# Patient Record
Sex: Female | Born: 1945 | Race: White | Hispanic: No | Smoking: Never smoker
Health system: Southern US, Community
[De-identification: ages and names within clinical notes are randomized; demographics above are authoritative.]

## PROBLEM LIST (undated history)

## (undated) DIAGNOSIS — R112 Nausea with vomiting, unspecified: Secondary | ICD-10-CM

## (undated) DIAGNOSIS — E039 Hypothyroidism, unspecified: Secondary | ICD-10-CM

## (undated) DIAGNOSIS — I351 Nonrheumatic aortic (valve) insufficiency: Secondary | ICD-10-CM

## (undated) DIAGNOSIS — H9190 Unspecified hearing loss, unspecified ear: Secondary | ICD-10-CM

## (undated) DIAGNOSIS — I071 Rheumatic tricuspid insufficiency: Secondary | ICD-10-CM

## (undated) DIAGNOSIS — E079 Disorder of thyroid, unspecified: Secondary | ICD-10-CM

## (undated) DIAGNOSIS — Z9889 Other specified postprocedural states: Secondary | ICD-10-CM

## (undated) DIAGNOSIS — D649 Anemia, unspecified: Secondary | ICD-10-CM

## (undated) DIAGNOSIS — I10 Essential (primary) hypertension: Secondary | ICD-10-CM

## (undated) DIAGNOSIS — K589 Irritable bowel syndrome without diarrhea: Secondary | ICD-10-CM

## (undated) DIAGNOSIS — I371 Nonrheumatic pulmonary valve insufficiency: Secondary | ICD-10-CM

## (undated) DIAGNOSIS — R011 Cardiac murmur, unspecified: Secondary | ICD-10-CM

## (undated) DIAGNOSIS — M1990 Unspecified osteoarthritis, unspecified site: Secondary | ICD-10-CM

## (undated) DIAGNOSIS — E785 Hyperlipidemia, unspecified: Secondary | ICD-10-CM

## (undated) DIAGNOSIS — K219 Gastro-esophageal reflux disease without esophagitis: Secondary | ICD-10-CM

## (undated) DIAGNOSIS — S83289A Other tear of lateral meniscus, current injury, unspecified knee, initial encounter: Secondary | ICD-10-CM

## (undated) DIAGNOSIS — F419 Anxiety disorder, unspecified: Secondary | ICD-10-CM

## (undated) DIAGNOSIS — F32A Depression, unspecified: Secondary | ICD-10-CM

## (undated) DIAGNOSIS — F329 Major depressive disorder, single episode, unspecified: Secondary | ICD-10-CM

## (undated) DIAGNOSIS — I340 Nonrheumatic mitral (valve) insufficiency: Secondary | ICD-10-CM

## (undated) DIAGNOSIS — R002 Palpitations: Secondary | ICD-10-CM

## (undated) HISTORY — PX: TONSILLECTOMY AND ADENOIDECTOMY: SUR1326

---

## 1972-03-19 HISTORY — PX: DILATION AND CURETTAGE OF UTERUS: SHX78

## 1974-03-19 HISTORY — PX: VAGINAL HYSTERECTOMY: SUR661

## 2000-03-19 HISTORY — PX: CHOLECYSTECTOMY: SHX55

## 2010-03-19 HISTORY — PX: COLONOSCOPY: SHX174

## 2011-05-20 ENCOUNTER — Other Ambulatory Visit: Payer: Self-pay | Admitting: Orthopedic Surgery

## 2011-06-19 ENCOUNTER — Encounter (HOSPITAL_BASED_OUTPATIENT_CLINIC_OR_DEPARTMENT_OTHER): Payer: Self-pay | Admitting: *Deleted

## 2011-06-19 NOTE — Progress Notes (Signed)
NPO AFTER MN WITH EXCEPTION WATER/ GATORADE UNTIL 0700. ARRIVES AT 1100. WILL TAKE PRILOSEC AM OF SURG W/ SIP WATER.

## 2011-06-27 ENCOUNTER — Encounter (HOSPITAL_BASED_OUTPATIENT_CLINIC_OR_DEPARTMENT_OTHER): Payer: Self-pay | Admitting: Anesthesiology

## 2011-06-27 ENCOUNTER — Encounter (HOSPITAL_BASED_OUTPATIENT_CLINIC_OR_DEPARTMENT_OTHER): Payer: Self-pay | Admitting: *Deleted

## 2011-06-27 ENCOUNTER — Ambulatory Visit (HOSPITAL_BASED_OUTPATIENT_CLINIC_OR_DEPARTMENT_OTHER)
Admission: RE | Admit: 2011-06-27 | Discharge: 2011-06-27 | Disposition: A | Discharge status: Home / Self Care | Payer: Medicare Other | Source: Ambulatory Visit | Attending: Orthopedic Surgery | Admitting: Orthopedic Surgery

## 2011-06-27 ENCOUNTER — Ambulatory Visit (HOSPITAL_BASED_OUTPATIENT_CLINIC_OR_DEPARTMENT_OTHER): Payer: Medicare Other | Admitting: Anesthesiology

## 2011-06-27 ENCOUNTER — Encounter (HOSPITAL_BASED_OUTPATIENT_CLINIC_OR_DEPARTMENT_OTHER)
Admission: RE | Disposition: A | Discharge status: Home / Self Care | Payer: Self-pay | Source: Ambulatory Visit | Attending: Orthopedic Surgery

## 2011-06-27 DIAGNOSIS — E785 Hyperlipidemia, unspecified: Secondary | ICD-10-CM | POA: Insufficient documentation

## 2011-06-27 DIAGNOSIS — IMO0002 Reserved for concepts with insufficient information to code with codable children: Secondary | ICD-10-CM | POA: Insufficient documentation

## 2011-06-27 DIAGNOSIS — X58XXXA Exposure to other specified factors, initial encounter: Secondary | ICD-10-CM | POA: Insufficient documentation

## 2011-06-27 DIAGNOSIS — S83289A Other tear of lateral meniscus, current injury, unspecified knee, initial encounter: Secondary | ICD-10-CM | POA: Diagnosis present

## 2011-06-27 DIAGNOSIS — Z79899 Other long term (current) drug therapy: Secondary | ICD-10-CM | POA: Insufficient documentation

## 2011-06-27 DIAGNOSIS — K219 Gastro-esophageal reflux disease without esophagitis: Secondary | ICD-10-CM | POA: Insufficient documentation

## 2011-06-27 HISTORY — DX: Nausea with vomiting, unspecified: Z98.890

## 2011-06-27 HISTORY — DX: Palpitations: R00.2

## 2011-06-27 HISTORY — DX: Cardiac murmur, unspecified: R01.1

## 2011-06-27 HISTORY — DX: Nausea with vomiting, unspecified: R11.2

## 2011-06-27 HISTORY — DX: Hyperlipidemia, unspecified: E78.5

## 2011-06-27 HISTORY — DX: Gastro-esophageal reflux disease without esophagitis: K21.9

## 2011-06-27 HISTORY — PX: KNEE ARTHROSCOPY: SHX127

## 2011-06-27 HISTORY — DX: Unspecified osteoarthritis, unspecified site: M19.90

## 2011-06-27 HISTORY — DX: Other tear of lateral meniscus, current injury, unspecified knee, initial encounter: S83.289A

## 2011-06-27 SURGERY — ARTHROSCOPY, KNEE
Anesthesia: General | Site: Knee | Laterality: Left | Wound class: Clean

## 2011-06-27 MED ORDER — CEFAZOLIN SODIUM-DEXTROSE 2-3 GM-% IV SOLR: 2.0000 g | INTRAVENOUS | Status: DC

## 2011-06-27 MED ORDER — OXYCODONE HCL 5 MG PO TABS: 5.0000 mg | ORAL_TABLET | ORAL | Status: AC | PRN

## 2011-06-27 MED ORDER — ONDANSETRON HCL 4 MG PO TABS: 4.0000 mg | ORAL_TABLET | Freq: Three times a day (TID) | ORAL | Status: AC | PRN

## 2011-06-27 MED ORDER — CEFAZOLIN SODIUM 1-5 GM-% IV SOLN
1.0000 g | INTRAVENOUS | Status: AC
  Administered 2011-06-27: 1 g via INTRAVENOUS

## 2011-06-27 MED ORDER — METHOCARBAMOL 500 MG PO TABS: 500.0000 mg | ORAL_TABLET | Freq: Four times a day (QID) | ORAL | Status: AC

## 2011-06-27 MED ORDER — KETOROLAC TROMETHAMINE 30 MG/ML IJ SOLN: 15.0000 mg | Freq: Once | INTRAMUSCULAR | Status: DC | PRN

## 2011-06-27 MED ORDER — PROPOFOL 10 MG/ML IV EMUL
INTRAVENOUS | Status: DC | PRN
  Administered 2011-06-27: 280 mg via INTRAVENOUS

## 2011-06-27 MED ORDER — ONDANSETRON HCL 4 MG/2ML IJ SOLN
INTRAMUSCULAR | Status: DC | PRN
  Administered 2011-06-27: 4 mg via INTRAVENOUS

## 2011-06-27 MED ORDER — FENTANYL CITRATE 0.05 MG/ML IJ SOLN
INTRAMUSCULAR | Status: DC | PRN
  Administered 2011-06-27 (×6): 50 ug via INTRAVENOUS

## 2011-06-27 MED ORDER — CHLORHEXIDINE GLUCONATE 4 % EX LIQD: 60.0000 mL | Freq: Once | CUTANEOUS | Status: DC

## 2011-06-27 MED ORDER — LACTATED RINGERS IV SOLN
INTRAVENOUS | Status: DC
  Administered 2011-06-27 (×3): via INTRAVENOUS

## 2011-06-27 MED ORDER — LIDOCAINE HCL (CARDIAC) 20 MG/ML IV SOLN
INTRAVENOUS | Status: DC | PRN
  Administered 2011-06-27: 80 mg via INTRAVENOUS

## 2011-06-27 MED ORDER — DIPHENHYDRAMINE HCL 50 MG/ML IJ SOLN
12.5000 mg | Freq: Once | INTRAMUSCULAR | Status: AC
  Administered 2011-06-27: 12.5 mg via INTRAVENOUS

## 2011-06-27 MED ORDER — DEXAMETHASONE SODIUM PHOSPHATE 10 MG/ML IJ SOLN: 10.0000 mg | Freq: Once | INTRAMUSCULAR | Status: DC

## 2011-06-27 MED ORDER — PROMETHAZINE HCL 25 MG/ML IJ SOLN: 6.2500 mg | INTRAMUSCULAR | Status: DC | PRN

## 2011-06-27 MED ORDER — FENTANYL CITRATE 0.05 MG/ML IJ SOLN: 25.0000 ug | INTRAMUSCULAR | Status: DC | PRN

## 2011-06-27 MED ORDER — OXYCODONE HCL 5 MG PO TABS
5.0000 mg | ORAL_TABLET | ORAL | Status: DC | PRN
  Administered 2011-06-27: 5 mg via ORAL

## 2011-06-27 MED ORDER — BUPIVACAINE-EPINEPHRINE 0.25% -1:200000 IJ SOLN
INTRAMUSCULAR | Status: DC | PRN
  Administered 2011-06-27: 25 mL

## 2011-06-27 MED ORDER — SODIUM CHLORIDE 0.9 % IR SOLN
Status: DC | PRN
  Administered 2011-06-27: 6000 mL

## 2011-06-27 MED ORDER — ACETAMINOPHEN 10 MG/ML IV SOLN
1000.0000 mg | Freq: Once | INTRAVENOUS | Status: AC
  Administered 2011-06-27: 1000 mg via INTRAVENOUS

## 2011-06-27 MED ORDER — SODIUM CHLORIDE 0.9 % IV SOLN: INTRAVENOUS | Status: DC

## 2011-06-27 SURGICAL SUPPLY — 35 items
BANDAGE ELASTIC 6 VELCRO ST LF (GAUZE/BANDAGES/DRESSINGS) ×2 IMPLANT
BLADE 4.2CUDA (BLADE) ×2 IMPLANT
BLADE CUDA SHAVER 3.5 (BLADE) IMPLANT
BLADE CUTTER GATOR 3.5 (BLADE) IMPLANT
CANISTER SUCT LVC 12 LTR MEDI- (MISCELLANEOUS) ×2 IMPLANT
CANISTER SUCTION 2500CC (MISCELLANEOUS) IMPLANT
CLOTH BEACON ORANGE TIMEOUT ST (SAFETY) ×2 IMPLANT
DRAPE ARTHROSCOPY W/POUCH 114 (DRAPES) ×4 IMPLANT
DRSG EMULSION OIL 3X3 NADH (GAUZE/BANDAGES/DRESSINGS) ×2 IMPLANT
DRSG PAD ABDOMINAL 8X10 ST (GAUZE/BANDAGES/DRESSINGS) ×2 IMPLANT
DURAPREP 26ML APPLICATOR (WOUND CARE) ×2 IMPLANT
ELECT MENISCUS 165MM 90D (ELECTRODE) IMPLANT
ELECT REM PT RETURN 9FT ADLT (ELECTROSURGICAL) ×2
ELECTRODE REM PT RTRN 9FT ADLT (ELECTROSURGICAL) ×1 IMPLANT
GLOVE BIO SURGEON STRL SZ8 (GLOVE) ×2 IMPLANT
GLOVE ECLIPSE 6.0 STRL STRAW (GLOVE) ×2 IMPLANT
GLOVE INDICATOR 6.5 STRL GRN (GLOVE) ×2 IMPLANT
GLOVE INDICATOR 8.0 STRL GRN (GLOVE) ×2 IMPLANT
GOWN PREVENTION PLUS LG XLONG (DISPOSABLE) ×4 IMPLANT
IV NS IRRIG 3000ML ARTHROMATIC (IV SOLUTION) ×2 IMPLANT
KNEE WRAP E Z 3 GEL PACK (MISCELLANEOUS) ×2 IMPLANT
PACK ARTHROSCOPY DSU (CUSTOM PROCEDURE TRAY) ×2 IMPLANT
PACK BASIN DAY SURGERY FS (CUSTOM PROCEDURE TRAY) ×2 IMPLANT
PADDING CAST ABS 4INX4YD NS (CAST SUPPLIES) ×1
PADDING CAST ABS COTTON 4X4 ST (CAST SUPPLIES) ×1 IMPLANT
PADDING CAST COTTON 6X4 STRL (CAST SUPPLIES) ×2 IMPLANT
PENCIL BUTTON HOLSTER BLD 10FT (ELECTRODE) IMPLANT
SET ARTHROSCOPY TUBING (MISCELLANEOUS) ×1
SET ARTHROSCOPY TUBING LN (MISCELLANEOUS) ×1 IMPLANT
SPONGE GAUZE 4X4 12PLY (GAUZE/BANDAGES/DRESSINGS) ×2 IMPLANT
SUT ETHILON 4 0 PS 2 18 (SUTURE) ×2 IMPLANT
TOWEL OR 17X24 6PK STRL BLUE (TOWEL DISPOSABLE) ×2 IMPLANT
WAND 30 DEG SABER W/CORD (SURGICAL WAND) IMPLANT
WAND 90 DEG TURBOVAC W/CORD (SURGICAL WAND) ×2 IMPLANT
WATER STERILE IRR 500ML POUR (IV SOLUTION) ×2 IMPLANT

## 2011-06-27 NOTE — Transfer of Care (Signed)
Immediate Anesthesia Transfer of Care Note  Patient: Tracey Kramer  Procedure(s) Performed: Procedure(s) (LRB): ARTHROSCOPY KNEE (Left)  Patient Location: PACU  Anesthesia Type: General  Level of Consciousness: drowsy, arouses to name, follows commands,  Airway & Oxygen Therapy: Patient Spontanous Breathing and Patient connected to face mask oxygen  Post-op Assessment: Report given to PACU RN and Post -op Vital signs reviewed and stable  Post vital signs: Reviewed and stable  Complications: No apparent anesthesia complications

## 2011-06-27 NOTE — Addendum Note (Signed)
Addendum  created 06/27/11 1400 by Eilene Ghazi, MD   Modules edited:Orders, PRL Based Order Sets

## 2011-06-27 NOTE — H&P (Signed)
  CC- Tracey Kramer is a 66 y.o. female who presents with left knee pain.  HPI- . Knee Pain: Patient presents with knee pain involving the  left knee. Onset of the symptoms was several months ago. Inciting event: none known. Current symptoms include giving out and pain located laterally. Pain is aggravated by going up and down stairs, rising after sitting and squatting.  Patient has had no prior knee problems. Evaluation to date: MRI with lateral meniscal tear.   Past Medical History  Diagnosis Date  . Acid reflux occasional  . Constipation   . Arthritis   . Acute lateral meniscal tear LEFT KNEE  . Heart palpitations   . Hyperlipidemia   . Heart murmur   . PONV (postoperative nausea and vomiting)     Past Surgical History  Procedure Date  . Cholecystectomy 2002  . Tonsillectomy and adenoidectomy AS CHILD  . Vaginal hysterectomy 1976  . Dilation and curettage of uterus 1974    Prior to Admission medications   Medication Sig Start Date End Date Taking? Authorizing Provider  estradiol (VIVELLE-DOT) 0.05 MG/24HR Place 1 patch onto the skin once a week.   Yes Historical Provider, MD  ibuprofen (ADVIL,MOTRIN) 200 MG tablet Take 200 mg by mouth every 6 (six) hours as needed.   Yes Historical Provider, MD  levothyroxine (SYNTHROID, LEVOTHROID) 88 MCG tablet Take 88 mcg by mouth every evening.   Yes Historical Provider, MD  omeprazole (PRILOSEC) 20 MG capsule Take 20 mg by mouth as needed.   Yes Historical Provider, MD  polyethylene glycol (MIRALAX / GLYCOLAX) packet Take 17 g by mouth daily.   Yes Historical Provider, MD  simvastatin (ZOCOR) 20 MG tablet Take 20 mg by mouth every evening.   Yes Historical Provider, MD   Knee Exam soft tissue tenderness over lateral joint line, negative drawer sign, collateral ligaments intact, positive McMurray sign, normal ipsilateral hip exam  Physical Examination: General appearance - alert, well appearing, and in no distress Mental status - alert,  oriented to person, place, and time Chest - clear to auscultation, no wheezes, rales or rhonchi, symmetric air entry Heart - normal rate, regular rhythm, normal S1, S2, no murmurs, rubs, clicks or gallops Abdomen - soft, nontender, nondistended, no masses or organomegaly Neurological - alert, oriented, normal speech, no focal findings or movement disorder noted   Asessment/Plan--- Left knee lateral meniscal tear- - Plan left knee arthroscopy with meniscal debridement. Procedure risks and potential comps discussed with patient who elects to proceed. Goals are decreased pain and increased function with a high likelihood of achieving both

## 2011-06-27 NOTE — Anesthesia Preprocedure Evaluation (Signed)
Anesthesia Evaluation  Patient identified by MRN, date of birth, ID band Patient awake    Reviewed: Allergy & Precautions, H&P , NPO status , Patient's Chart, lab work & pertinent test results  History of Anesthesia Complications (+) PONV  Airway Mallampati: II TM Distance: >3 FB Neck ROM: Full    Dental No notable dental hx. (+) Teeth Intact and Dental Advisory Given   Pulmonary neg pulmonary ROS,  breath sounds clear to auscultation  Pulmonary exam normal       Cardiovascular negative cardio ROS  + Valvular Problems/Murmurs Rhythm:Regular Rate:Normal     Neuro/Psych negative neurological ROS  negative psych ROS   GI/Hepatic negative GI ROS, Neg liver ROS, GERD-  ,  Endo/Other  Hypothyroidism   Renal/GU negative Renal ROS  negative genitourinary   Musculoskeletal negative musculoskeletal ROS (+)   Abdominal   Peds  Hematology negative hematology ROS (+)   Anesthesia Other Findings   Reproductive/Obstetrics negative OB ROS                           Anesthesia Physical Anesthesia Plan  ASA: II  Anesthesia Plan: General   Post-op Pain Management:    Induction: Intravenous  Airway Management Planned: LMA  Additional Equipment:   Intra-op Plan:   Post-operative Plan: Extubation in OR  Informed Consent: I have reviewed the patients History and Physical, chart, labs and discussed the procedure including the risks, benefits and alternatives for the proposed anesthesia with the patient or authorized representative who has indicated his/her understanding and acceptance.   Dental advisory given  Plan Discussed with: CRNA  Anesthesia Plan Comments:         Anesthesia Quick Evaluation

## 2011-06-27 NOTE — Anesthesia Postprocedure Evaluation (Signed)
  Anesthesia Post-op Note  Patient: Tracey Kramer  Procedure(s) Performed: Procedure(s) (LRB): ARTHROSCOPY KNEE (Left)  Patient Location: PACU  Anesthesia Type: General  Level of Consciousness: awake and alert   Airway and Oxygen Therapy: Patient Spontanous Breathing  Post-op Pain: mild  Post-op Assessment: Post-op Vital signs reviewed, Patient's Cardiovascular Status Stable, Respiratory Function Stable, Patent Airway and No signs of Nausea or vomiting  Post-op Vital Signs: stable  Complications: No apparent anesthesia complications

## 2011-06-27 NOTE — Op Note (Signed)
Preoperative diagnosis-  Left knee lateral meniscal tear  Postoperative diagnosis Left- knee lateral meniscal tear   Procedure- Left knee arthroscopy with lateral  Meniscal debridement   Surgeon- Gus [name]. Cerina Leary, MD  Anesthesia-General  EBL-  Minimal  Complications- None  Condition- PACU - hemodynamically stable.  Brief clinical note- -Tracey Kramer is a 66 y.o.  female with a several month history of left knee pain and mechanical symptoms. Exam and history suggested lateral meniscal tear confirmed by MRI. The patient presents now for arthroscopy and debridement  Procedure in detail -       After successful administration of General anesthetic, a tourmiquet is placed high on the Left  thigh and the Left lower extremity is prepped and draped in the usual sterile fashion. Time out is performed by the surgical team. Standard superomedial and inferolateral portal sites are marked and incisions made with an 11 blade. The inflow cannula is passed through the superomedial portal and camera through the inferolateral portal and inflow is initiated. Arthroscopic visualization proceeds.      The undersurface of the patella and trochlea are visualized and appear normal. The medial and lateral gutters are visualized and there are  no loose bodies. Flexion and valgus force is applied to the knee and the medial compartment is entered. A spinal needle is passed into the joint through the site marked for the inferomedial portal. A small incision is made and the dilator passed into the joint. The findings for the medial compartment are tiny chondral defect medial femoral condyle . The shaver is used to debride the unstable cartilage to a stable cartilaginous base with stable edges. It is probed and found to be stable.    The intercondylar notch is visualized and the ACL appears normal. The lateral compartment is entered and the findings are lateral meniscal tear and a tiny area of exposed bone on the lateral  tibial plateau adjacent to the tibial eminence . The tear is debrided to a stable base with baskets and a shaver and sealed off with the Arthrocare. There are no unstable chondral defects laterally.     The joint is again inspected and there are no other tears, defects or loose bodies identified. The arthroscopic equipment is then removed from the inferior portals which are closed with interrupted 4-0 nylon. 20 ml of .25% Marcaine with epinephrine are injected through the inflow cannula and the cannula is then removed and the portal closed with nylon. The incisions are cleaned and dried and a bulky sterile dressing is applied. The patient is then awakened and transported to recovery in stable condition.   06/27/2011, 1:06 PM

## 2011-06-27 NOTE — Anesthesia Procedure Notes (Signed)
Procedure Name: LMA Insertion Date/Time: 06/27/2011 12:35 PM Performed by: Fran Lowes Pre-anesthesia Checklist: Patient identified, Emergency Drugs available, Suction available and Patient being monitored Patient Re-evaluated:Patient Re-evaluated prior to inductionOxygen Delivery Method: Circle System Utilized Preoxygenation: Pre-oxygenation with 100% oxygen Intubation Type: IV induction Ventilation: Mask ventilation without difficulty LMA: LMA inserted LMA Size: 4.0 Number of attempts: 1 Airway Equipment and Method: bite block Placement Confirmation: positive ETCO2 Tube secured with: Tape Dental Injury: Teeth and Oropharynx as per pre-operative assessment

## 2011-06-27 NOTE — Interval H&P Note (Signed)
History and Physical Interval Note:  06/27/2011 12:28 PM  Tracey Kramer  has presented today for surgery, with the diagnosis of LEFT KNEE LATERAL MENISCAL TEAR  The various methods of treatment have been discussed with the patient and family. After consideration of risks, benefits and other options for treatment, the patient has consented to  Procedure(s) (LRB): ARTHROSCOPY KNEE (Left) as a surgical intervention .  The patients' history has been reviewed, patient examined, no change in status, stable for surgery.  I have reviewed the patients' chart and labs.  Questions were answered to the patient's satisfaction.     Loanne Drilling

## 2011-06-27 NOTE — Discharge Instructions (Addendum)
  Post Anesthesia Home Care Instructions  Activity: Get plenty of rest for the remainder of the day. A responsible adult should stay with you for 24 hours following the procedure.  For the next 24 hours, DO NOT: -Drive a car -Advertising copywriter -Drink alcoholic beverages -Take any medication unless instructed by your physician -Make any legal decisions or sign important papers.  Meals: Start with liquid foods such as gelatin or soup. Progress to regular foods as tolerated. Avoid greasy, spicy, heavy foods. If nausea and/or vomiting occur, drink only clear liquids until the nausea and/or vomiting subsides. Call your physician if vomiting continues.  Special Instructions/Symptoms: Your throat may feel dry or sore from the anesthesia or the breathing tube placed in your throat during surgery. If this causes discomfort, gargle with warm salt water. The discomfort should disappear within 24 hours.  Arthroscopic Procedure, Knee An arthroscopic procedure can find what is wrong with your knee. PROCEDURE Arthroscopy is a surgical technique that allows your orthopedic surgeon to diagnose and treat your knee injury with accuracy. They will look into your knee through a small instrument. This is almost like a small (pencil sized) telescope. Because arthroscopy affects your knee less than open knee surgery, you can anticipate a more rapid recovery. Taking an active role by following your caregiver's instructions will help with rapid and complete recovery. Use crutches, rest, elevation, ice, and knee exercises as instructed. The length of recovery depends on various factors including type of injury, age, physical condition, medical conditions, and your rehabilitation. Your knee is the joint between the large bones (femur and tibia) in your leg. Cartilage covers these bone ends which are smooth and slippery and allow your knee to bend and move smoothly. Two menisci, thick, semi-lunar shaped pads of cartilage  which form a rim inside the joint, help absorb shock and stabilize your knee. Ligaments bind the bones together and support your knee joint. Muscles move the joint, help support your knee, and take stress off the joint itself. Because of this all programs and physical therapy to rehabilitate an injured or repaired knee require rebuilding and strengthening your muscles. AFTER THE PROCEDURE  After the procedure, you will be moved to a recovery area until most of the effects of the medication have worn off. Your caregiver will discuss the test results with you.   Only take over-the-counter or prescription medicines for pain, discomfort, or fever as directed by your caregiver.  SEEK MEDICAL CARE IF:   You have increased bleeding from your wounds.   You see redness, swelling, or have increasing pain in your wounds.   You have pus coming from your wound.   You have an oral temperature above 102 F (38.9 C).   You notice a bad smell coming from the wound or dressing.   You have severe pain with any motion of your knee.  SEEK IMMEDIATE MEDICAL CARE IF:   You develop a rash.   You have difficulty breathing.   You have any allergic problems.  Document Released: 03/02/2000 Document Revised: 02/22/2011 Document Reviewed: 09/24/2007 [facility] Patient Information 2012 [place], [place].

## 2011-06-28 ENCOUNTER — Encounter (HOSPITAL_BASED_OUTPATIENT_CLINIC_OR_DEPARTMENT_OTHER): Payer: Self-pay | Admitting: Orthopedic Surgery

## 2011-07-03 ENCOUNTER — Encounter (HOSPITAL_BASED_OUTPATIENT_CLINIC_OR_DEPARTMENT_OTHER): Payer: Self-pay

## 2011-10-23 NOTE — Addendum Note (Signed)
Addendum  created 10/23/11 0706 by Eilene Ghazi, MD   Modules edited:Anesthesia Attestations, Anesthesia Responsible Staff

## 2011-10-23 NOTE — Addendum Note (Signed)
Addendum  created 10/23/11 0706 by Afsana Liera, MD   Modules edited:Anesthesia Attestations, Anesthesia Responsible Staff    

## 2012-03-17 ENCOUNTER — Other Ambulatory Visit (HOSPITAL_COMMUNITY): Payer: Self-pay | Admitting: Orthopedic Surgery

## 2012-03-17 DIAGNOSIS — M898X6 Other specified disorders of bone, lower leg: Secondary | ICD-10-CM

## 2012-03-27 ENCOUNTER — Encounter (HOSPITAL_COMMUNITY): Payer: Medicare Other

## 2015-11-29 ENCOUNTER — Encounter (HOSPITAL_COMMUNITY): Payer: Medicare Other

## 2015-12-05 ENCOUNTER — Encounter (HOSPITAL_COMMUNITY): Admission: RE | Payer: Self-pay | Source: Ambulatory Visit

## 2015-12-05 ENCOUNTER — Inpatient Hospital Stay (HOSPITAL_COMMUNITY): Admission: RE | Admit: 2015-12-05 | Payer: Medicare Other | Source: Ambulatory Visit | Admitting: Orthopedic Surgery

## 2015-12-05 SURGERY — ARTHROPLASTY, KNEE, TOTAL
Anesthesia: Choice | Site: Knee | Laterality: Left

## 2017-01-04 ENCOUNTER — Observation Stay (HOSPITAL_BASED_OUTPATIENT_CLINIC_OR_DEPARTMENT_OTHER)
Admission: EM | Admit: 2017-01-04 | Discharge: 2017-01-06 | Disposition: A | Discharge status: Home / Self Care | Payer: Medicare HMO | Attending: Family Medicine | Admitting: Family Medicine

## 2017-01-04 ENCOUNTER — Encounter (HOSPITAL_BASED_OUTPATIENT_CLINIC_OR_DEPARTMENT_OTHER): Payer: Self-pay

## 2017-01-04 ENCOUNTER — Emergency Department (HOSPITAL_BASED_OUTPATIENT_CLINIC_OR_DEPARTMENT_OTHER): Payer: Medicare HMO

## 2017-01-04 DIAGNOSIS — I10 Essential (primary) hypertension: Secondary | ICD-10-CM | POA: Diagnosis not present

## 2017-01-04 DIAGNOSIS — N390 Urinary tract infection, site not specified: Secondary | ICD-10-CM | POA: Diagnosis not present

## 2017-01-04 DIAGNOSIS — Z7982 Long term (current) use of aspirin: Secondary | ICD-10-CM | POA: Diagnosis not present

## 2017-01-04 DIAGNOSIS — K529 Noninfective gastroenteritis and colitis, unspecified: Secondary | ICD-10-CM

## 2017-01-04 DIAGNOSIS — K862 Cyst of pancreas: Secondary | ICD-10-CM | POA: Diagnosis not present

## 2017-01-04 DIAGNOSIS — E039 Hypothyroidism, unspecified: Secondary | ICD-10-CM | POA: Diagnosis present

## 2017-01-04 DIAGNOSIS — Z79899 Other long term (current) drug therapy: Secondary | ICD-10-CM | POA: Diagnosis not present

## 2017-01-04 DIAGNOSIS — A020 Salmonella enteritis: Secondary | ICD-10-CM | POA: Insufficient documentation

## 2017-01-04 DIAGNOSIS — Z79890 Hormone replacement therapy (postmenopausal): Secondary | ICD-10-CM | POA: Diagnosis not present

## 2017-01-04 DIAGNOSIS — E860 Dehydration: Secondary | ICD-10-CM | POA: Diagnosis not present

## 2017-01-04 DIAGNOSIS — Z880 Allergy status to penicillin: Secondary | ICD-10-CM | POA: Diagnosis not present

## 2017-01-04 DIAGNOSIS — K5100 Ulcerative (chronic) pancolitis without complications: Principal | ICD-10-CM | POA: Diagnosis present

## 2017-01-04 DIAGNOSIS — M1990 Unspecified osteoarthritis, unspecified site: Secondary | ICD-10-CM | POA: Diagnosis not present

## 2017-01-04 DIAGNOSIS — E785 Hyperlipidemia, unspecified: Secondary | ICD-10-CM | POA: Diagnosis present

## 2017-01-04 DIAGNOSIS — E876 Hypokalemia: Secondary | ICD-10-CM | POA: Diagnosis not present

## 2017-01-04 DIAGNOSIS — K589 Irritable bowel syndrome without diarrhea: Secondary | ICD-10-CM | POA: Diagnosis not present

## 2017-01-04 DIAGNOSIS — Z9049 Acquired absence of other specified parts of digestive tract: Secondary | ICD-10-CM | POA: Insufficient documentation

## 2017-01-04 DIAGNOSIS — E871 Hypo-osmolality and hyponatremia: Secondary | ICD-10-CM | POA: Diagnosis present

## 2017-01-04 DIAGNOSIS — Z90710 Acquired absence of both cervix and uterus: Secondary | ICD-10-CM | POA: Insufficient documentation

## 2017-01-04 HISTORY — DX: Disorder of thyroid, unspecified: E07.9

## 2017-01-04 HISTORY — DX: Essential (primary) hypertension: I10

## 2017-01-04 LAB — CBC WITH DIFFERENTIAL/PLATELET
BASOS PCT: 0 %
Basophils Absolute: 0 10*3/uL (ref 0.0–0.1)
EOS ABS: 0 10*3/uL (ref 0.0–0.7)
EOS PCT: 0 %
HCT: 40.3 % (ref 36.0–46.0)
Hemoglobin: 14 g/dL (ref 12.0–15.0)
Lymphocytes Relative: 8 %
Lymphs Abs: 0.8 10*3/uL (ref 0.7–4.0)
MCH: 30.4 pg (ref 26.0–34.0)
MCHC: 34.7 g/dL (ref 30.0–36.0)
MCV: 87.4 fL (ref 78.0–100.0)
MONO ABS: 1.1 10*3/uL — AB (ref 0.1–1.0)
MONOS PCT: 12 %
Neutro Abs: 7.2 10*3/uL (ref 1.7–7.7)
Neutrophils Relative %: 80 %
PLATELETS: 243 10*3/uL (ref 150–400)
RBC: 4.61 MIL/uL (ref 3.87–5.11)
RDW: 12.9 % (ref 11.5–15.5)
WBC: 9 10*3/uL (ref 4.0–10.5)

## 2017-01-04 LAB — COMPREHENSIVE METABOLIC PANEL
ALBUMIN: 3.7 g/dL (ref 3.5–5.0)
ALK PHOS: 63 U/L (ref 38–126)
ALT: 27 U/L (ref 14–54)
AST: 29 U/L (ref 15–41)
Anion gap: 10 (ref 5–15)
BILIRUBIN TOTAL: 1.1 mg/dL (ref 0.3–1.2)
BUN: 20 mg/dL (ref 6–20)
CALCIUM: 8.7 mg/dL — AB (ref 8.9–10.3)
CO2: 24 mmol/L (ref 22–32)
CREATININE: 0.88 mg/dL (ref 0.44–1.00)
Chloride: 94 mmol/L — ABNORMAL LOW (ref 101–111)
GFR calc non Af Amer: >60 mL/min (ref >60)
GLUCOSE: 124 mg/dL — AB (ref 65–99)
Potassium: 3.9 mmol/L (ref 3.5–5.1)
SODIUM: 128 mmol/L — AB (ref 135–145)
TOTAL PROTEIN: 7 g/dL (ref 6.5–8.1)

## 2017-01-04 LAB — URINALYSIS, MICROSCOPIC (REFLEX)

## 2017-01-04 LAB — URINALYSIS, ROUTINE W REFLEX MICROSCOPIC
Glucose, UA: NEGATIVE mg/dL
KETONES UR: 15 mg/dL — AB
NITRITE: NEGATIVE
PH: 6 (ref 5.0–8.0)
Protein, ur: 100 mg/dL — AB
Specific Gravity, Urine: >1.03 — ABNORMAL HIGH (ref 1.005–1.030)

## 2017-01-04 LAB — I-STAT CG4 LACTIC ACID, ED: Lactic Acid, Venous: 1.53 mmol/L (ref 0.5–1.9)

## 2017-01-04 MED ORDER — METRONIDAZOLE IN NACL 5-0.79 MG/ML-% IV SOLN
500.0000 mg | Freq: Three times a day (TID) | INTRAVENOUS | Status: DC
  Administered 2017-01-04 – 2017-01-06 (×5): 500 mg via INTRAVENOUS
  Filled 2017-01-04 (×6): qty 100

## 2017-01-04 MED ORDER — ACETAMINOPHEN 500 MG PO TABS
1000.0000 mg | ORAL_TABLET | Freq: Once | ORAL | Status: AC
  Administered 2017-01-04: 1000 mg via ORAL
  Filled 2017-01-04: qty 2

## 2017-01-04 MED ORDER — MELOXICAM 15 MG PO TABS
15.0000 mg | ORAL_TABLET | Freq: Every day | ORAL | Status: DC | PRN
  Filled 2017-01-04: qty 1

## 2017-01-04 MED ORDER — ENOXAPARIN SODIUM 40 MG/0.4ML ~~LOC~~ SOLN
40.0000 mg | SUBCUTANEOUS | Status: DC
  Administered 2017-01-04 – 2017-01-05 (×2): 40 mg via SUBCUTANEOUS
  Filled 2017-01-04 (×2): qty 0.4

## 2017-01-04 MED ORDER — METRONIDAZOLE IN NACL 5-0.79 MG/ML-% IV SOLN
500.0000 mg | Freq: Once | INTRAVENOUS | Status: AC
  Administered 2017-01-04: 500 mg via INTRAVENOUS
  Filled 2017-01-04: qty 100

## 2017-01-04 MED ORDER — IOPAMIDOL (ISOVUE-300) INJECTION 61%
100.0000 mL | Freq: Once | INTRAVENOUS | Status: AC | PRN
  Administered 2017-01-04: 100 mL via INTRAVENOUS

## 2017-01-04 MED ORDER — IBUPROFEN 200 MG PO TABS: 200.0000 mg | ORAL_TABLET | Freq: Four times a day (QID) | ORAL | Status: DC | PRN

## 2017-01-04 MED ORDER — SODIUM CHLORIDE 0.9 % IV SOLN: INTRAVENOUS | Status: DC

## 2017-01-04 MED ORDER — LEVOFLOXACIN IN D5W 750 MG/150ML IV SOLN
750.0000 mg | Freq: Once | INTRAVENOUS | Status: AC
  Administered 2017-01-04: 750 mg via INTRAVENOUS
  Filled 2017-01-04: qty 150

## 2017-01-04 MED ORDER — ONDANSETRON HCL 4 MG/2ML IJ SOLN
4.0000 mg | Freq: Once | INTRAMUSCULAR | Status: AC
  Administered 2017-01-04: 4 mg via INTRAVENOUS
  Filled 2017-01-04: qty 2

## 2017-01-04 MED ORDER — LEVOFLOXACIN IN D5W 500 MG/100ML IV SOLN
500.0000 mg | INTRAVENOUS | Status: DC
  Administered 2017-01-05 – 2017-01-06 (×2): 500 mg via INTRAVENOUS
  Filled 2017-01-04 (×2): qty 100

## 2017-01-04 MED ORDER — ASPIRIN 81 MG PO TABS
81.0000 mg | ORAL_TABLET | Freq: Every day | ORAL | Status: DC
  Filled 2017-01-04 (×2): qty 1

## 2017-01-04 MED ORDER — SODIUM CHLORIDE 0.9 % IV BOLUS (SEPSIS)
1000.0000 mL | Freq: Once | INTRAVENOUS | Status: AC
  Administered 2017-01-04: 1000 mL via INTRAVENOUS

## 2017-01-04 MED ORDER — LEVOTHYROXINE SODIUM 88 MCG PO TABS
88.0000 ug | ORAL_TABLET | Freq: Every evening | ORAL | Status: DC
  Administered 2017-01-05: 88 ug via ORAL
  Filled 2017-01-04: qty 1

## 2017-01-04 MED ORDER — LORATADINE 10 MG PO TABS: 10.0000 mg | ORAL_TABLET | Freq: Every day | ORAL | Status: DC | PRN

## 2017-01-04 MED ORDER — ACETAMINOPHEN 325 MG PO TABS: 650.0000 mg | ORAL_TABLET | Freq: Four times a day (QID) | ORAL | Status: DC | PRN

## 2017-01-04 MED ORDER — PANTOPRAZOLE SODIUM 40 MG PO TBEC
40.0000 mg | DELAYED_RELEASE_TABLET | Freq: Every day | ORAL | Status: DC | PRN
  Administered 2017-01-04: 40 mg via ORAL
  Filled 2017-01-04: qty 1

## 2017-01-04 MED ORDER — ONDANSETRON HCL 4 MG/2ML IJ SOLN
4.0000 mg | Freq: Four times a day (QID) | INTRAMUSCULAR | Status: DC | PRN
  Administered 2017-01-04 – 2017-01-05 (×2): 4 mg via INTRAVENOUS
  Filled 2017-01-04 (×2): qty 2

## 2017-01-04 MED ORDER — SIMVASTATIN 10 MG PO TABS
20.0000 mg | ORAL_TABLET | Freq: Every evening | ORAL | Status: DC
  Administered 2017-01-05: 20 mg via ORAL
  Filled 2017-01-04: qty 2

## 2017-01-04 MED ORDER — ESTRADIOL 1 MG PO TABS
0.5000 mg | ORAL_TABLET | Freq: Every day | ORAL | Status: DC
  Administered 2017-01-05 – 2017-01-06 (×2): 0.5 mg via ORAL
  Filled 2017-01-04 (×2): qty 0.5

## 2017-01-04 MED ORDER — ACETAMINOPHEN 650 MG RE SUPP: 650.0000 mg | Freq: Four times a day (QID) | RECTAL | Status: DC | PRN

## 2017-01-04 NOTE — ED Provider Notes (Signed)
[facility] EMERGENCY DEPARTMENT Provider Note   CSN: [id number] Arrival date & time: 01/04/17  [id number]     History   Chief Complaint Chief Complaint  Patient presents with  . Diarrhea    HPI Tracey Kramer is a 71 y.o. female history of hyperlipidemia, hypertension here presenting with nausea, diarrhea, fever. Patient states that she has been sick for the last 3 days. She has been nauseated and having poor appetite. Also had about 8-10 episodes of watery diarrhea a day. Patient started taking Imodium yesterday with no relief. Patient denies any recent travel or eating uncooked food. She never had C. Difficile in the past and no recent about antibiotics. Patient had subjective chills yesterday but they did not take her temperature at home. Patient was brought in by her husband because she is not feeling better. Denies any sick contacts.  The history is provided by the patient and the spouse.    Past Medical History:  Diagnosis Date  . Acid reflux occasional  . Acute lateral meniscal tear LEFT KNEE  . Arthritis   . Constipation   . Heart murmur   . Heart palpitations   . Hyperlipidemia   . Hypertension   . PONV (postoperative nausea and vomiting)   . Thyroid disease    hypothyroidism     Patient Active Problem List   Diagnosis Date Noted  . Acute lateral meniscal tear 06/27/2011    Past Surgical History:  Procedure Laterality Date  . CHOLECYSTECTOMY  2002  . DILATION AND CURETTAGE OF UTERUS  1974  . KNEE ARTHROSCOPY  06/27/2011   Procedure: ARTHROSCOPY KNEE;  Surgeon: Loanne DrillingFrank V Aluisio, MD;  [facility] [place];  Service: Orthopedics;  Laterality: Left;  LEFT KNEE SCOPE WITH lateral meniscal DEBRIDEMENT    . TONSILLECTOMY AND ADENOIDECTOMY  AS CHILD  . VAGINAL HYSTERECTOMY  1976    OB History    No data available       Home Medications    Prior to Admission medications   Medication Sig Start Date End Date Taking? Authorizing Provider    aspirin 81 MG tablet Take 81 mg by mouth daily.   Yes [provider]  cetirizine (ZYRTEC) 10 MG tablet Take 10 mg by mouth daily.   Yes [provider]  estradiol (ESTRACE) 0.5 MG tablet Take 0.5 mg by mouth daily.   Yes [provider]  furosemide (LASIX) 20 MG tablet Take 20 mg by mouth.   Yes [provider]  hydrochlorothiazide (HYDRODIURIL) 12.5 MG tablet Take 12.5 mg by mouth daily.   Yes [provider]  levothyroxine (SYNTHROID, LEVOTHROID) 88 MCG tablet Take 88 mcg by mouth every evening.   Yes [provider]  omeprazole (PRILOSEC) 20 MG capsule Take 20 mg by mouth as needed.   Yes [provider]  polyethylene glycol (MIRALAX / GLYCOLAX) packet Take 17 g by mouth daily.   Yes [provider]  simvastatin (ZOCOR) 20 MG tablet Take 20 mg by mouth every evening.   Yes [provider]  estradiol (VIVELLE-DOT) 0.05 MG/24HR Place 1 patch onto the skin once a week.    [provider]  ibuprofen (ADVIL,MOTRIN) 200 MG tablet Take 200 mg by mouth every 6 (six) hours as needed.    [provider]    Family History No family history on file.  Social History Social History  Substance Use Topics  . Smoking status: Never Smoker  . Smokeless tobacco: Never Used  .  Alcohol use No     Allergies   Amoxicillin   Review of Systems Review of Systems  Gastrointestinal: Positive for diarrhea.  All other systems reviewed and are negative.    Physical Exam Updated Vital Signs BP (!) 128/47 (BP Location: Right Arm)   Pulse 77   Temp 98.9 F (37.2 C) (Oral)   Resp 16   Ht 5\' 3"  (1.6 m)   Wt 61.2 kg (135 lb)   SpO2 97%   BMI 23.91 kg/m   Physical Exam  Constitutional: She is oriented to person, place, and time.  Dehydrated,   HENT:  Head: Normocephalic.  MM dry   Eyes: Pupils are equal, round, and reactive to light. Conjunctivae and EOM are normal.  Neck: Normal range of motion.  Neck supple.  Cardiovascular: Regular rhythm and normal heart sounds.   Mildly tachy   Pulmonary/Chest: Effort normal and breath sounds normal. No respiratory distress.  Abdominal: Soft. Bowel sounds are normal.  + LLQ tenderness   Musculoskeletal: Normal range of motion. She exhibits no edema.  Neurological: She is alert and oriented to person, place, and time. No cranial nerve deficit. Coordination normal.  Skin: Skin is warm.  Psychiatric: She has a normal mood and affect.  Nursing note and vitals reviewed.    ED Treatments / Results  Labs (all labs ordered are listed, but only abnormal results are displayed) Labs Reviewed  COMPREHENSIVE METABOLIC PANEL - Abnormal; Notable for the following:       Result Value   Sodium 128 (*)    Chloride 94 (*)    Glucose, Bld 124 (*)    Calcium 8.7 (*)    All other components within normal limits  CBC WITH DIFFERENTIAL/PLATELET - Abnormal; Notable for the following:    Monocytes Absolute 1.1 (*)    All other components within normal limits  URINALYSIS, ROUTINE W REFLEX MICROSCOPIC - Abnormal; Notable for the following:    APPearance CLOUDY (*)    Specific Gravity, Urine >1.030 (*)    Hgb urine dipstick SMALL (*)    Bilirubin Urine SMALL (*)    Ketones, ur 15 (*)    Protein, ur 100 (*)    Leukocytes, UA SMALL (*)    All other components within normal limits  URINALYSIS, MICROSCOPIC (REFLEX) - Abnormal; Notable for the following:    Bacteria, UA MANY (*)    Squamous Epithelial / LPF 0-5 (*)    All other components within normal limits  CULTURE, BLOOD (ROUTINE X 2)  CULTURE, BLOOD (ROUTINE X 2)  URINE CULTURE  GASTROINTESTINAL PANEL BY PCR, STOOL (REPLACES STOOL CULTURE)  C DIFFICILE QUICK SCREEN W PCR REFLEX  I-STAT CG4 LACTIC ACID, ED  I-STAT CG4 LACTIC ACID, ED    EKG  EKG Interpretation  Date/Time:  Friday January 04 2017 09:41:51 EDT Ventricular Rate:  91 PR Interval:    QRS Duration: 79 QT Interval:  345 QTC  Calculation: 425 R Axis:   71 Text Interpretation:  Sinus rhythm Borderline T wave abnormalities No significant change since last tracing Confirmed by Richardean Canal [id number]) on 01/04/2017 10:00:45 AM Also confirmed by Richardean Canal [id number]), editor Barbette Hair [phone])  on 01/04/2017 10:08:32 AM       Radiology Dg Chest 2 View  Result Date: 01/04/2017 CLINICAL DATA:  Fever. EXAM: CHEST  2 VIEW COMPARISON:  None. FINDINGS: The heart size and mediastinal contours are within normal limits. Both lungs are clear. No pneumothorax or pleural effusion is noted.  The visualized skeletal structures are unremarkable. IMPRESSION: No active cardiopulmonary disease. Electronically Signed   By: Lupita Raider, M.D.   On: 01/04/2017 11:14   Ct Abdomen Pelvis W Contrast  Result Date: 01/04/2017 CLINICAL DATA:  Nausea and diarrhea for 3 days. EXAM: CT ABDOMEN AND PELVIS WITH CONTRAST TECHNIQUE: Multidetector CT imaging of the abdomen and pelvis was performed using the standard protocol following bolus administration of intravenous contrast. CONTRAST:  ISOVUE-300 IOPAMIDOL (ISOVUE-300) INJECTION 61% COMPARISON:  None. FINDINGS: Lower chest: Lung bases are clear. Hepatobiliary: No focal hepatic lesion. Postcholecystectomy. No biliary dilatation. Pancreas: The pancreas head and uncinate normal. No pancreatic duct dilatation. In the body pancreas, there is low-attenuation cystic change over approximately 1.5 cm region (image 25, series 2). No mass lesion present. No duct dilatation. Tail the pancreas normal. Spleen: Normal spleen Adrenals/urinary tract: Adrenal glands and kidneys are normal. The ureters and bladder normal. Stomach/Bowel: Stomach, duodenum, small-bowel are normal. Terminal ileum is normal. Appendix not identified. There is submucosal edema involving the ascending, transverse, and descending colon as well as the rectosigmoid colon. Small amount fluid along the descending colon pericolic gutter (image 60,  series 2). The bowel wall thickening is seen to 6 mm in the descending colon for example on image 60, series 2. Submucosal mucosal edema in the ascending colon is exampled image 40, series 5. Trace amount free fluid along the sigmoid colon in the pelvis (image 73, series 2). No focal lesion. No diverticulosis. No fistula or abscess. Vascular/Lymphatic: Abdominal aorta is normal caliber with atherosclerotic calcification. Mesenteric vessels patent. There is no retroperitoneal or periportal lymphadenopathy. No pelvic lymphadenopathy. Reproductive: Post hysterectomy Other: No peritoneal abnormality.  Small volume free fluid Musculoskeletal: No aggressive osseous lesion. Normal sacroiliac joints. IMPRESSION: 1. Pancolitis with submucosal edema involving the entirety of the colon. Differential would include infectious colitis, inflammatory bowel disease, drug reaction, and less likely ischemic colitis. Mesenteric vessels are patent. 2. Subtle low attenuation within the mid body the pancreas is favored benign ductal ectasia. Recommend follow-up contrast MRI in 6 months per consensus criteria. This recommendation follows ACR consensus guidelines: Management of Incidental Pancreatic Cysts: A White Paper of the ACR Incidental Findings Committee. J Am Coll Radiol 2017;14:911-923. Electronically Signed   By: Genevive Bi M.D.   On: 01/04/2017 11:36    Procedures Procedures (including critical care time)  Medications Ordered in ED Medications  metroNIDAZOLE (FLAGYL) IVPB 500 mg (not administered)  levofloxacin (LEVAQUIN) IVPB 500 mg (not administered)  metroNIDAZOLE (FLAGYL) IVPB 500 mg (not administered)  sodium chloride 0.9 % bolus 1,000 mL (0 mLs Intravenous Stopped 01/04/17 1120)    And  sodium chloride 0.9 % bolus 1,000 mL (1,000 mLs Intravenous New Bag/Given 01/04/17 1033)  levofloxacin (LEVAQUIN) IVPB 750 mg (750 mg Intravenous New Bag/Given 01/04/17 1028)  acetaminophen (TYLENOL) tablet 1,000 mg  (1,000 mg Oral Given 01/04/17 1023)  iopamidol (ISOVUE-300) 61 % injection 100 mL (100 mLs Intravenous Contrast Given 01/04/17 1054)     Initial Impression / Assessment and Plan / ED Course  I have reviewed the triage vital signs and the nursing notes.  Pertinent labs & imaging results that were available during my care of the patient were reviewed by me and considered in my medical decision making (see chart for details).    Kynslei Art is a 71 y.o. female here with LLQ pain, fever. Febrile 101 in the ED. Likely colitis vs diverticulitis, also consider C diff. Will initiate sepsis workup and given 30 cc/kg bolus  and give levaquin, flagyl empirically. Will also get CT ab/pel.   12:20 PM CT showed pan colitis. Na 128, still unable to tolerate PO. Given levaquin, flagyl per protocol. C diff and GI pathogen panel sent. Will admit for observation at Med/surg at [facility]. Dr. Onalee Hua accepting.   Final Clinical Impressions(s) / ED Diagnoses   Final diagnoses:  None    New Prescriptions New Prescriptions   No medications on file     Charlynne Pander, MD 01/04/17 1220

## 2017-01-04 NOTE — ED Notes (Signed)
Pt drinking PO contrast while waiting for labs to result, per CT protocol, pt >60 yrs of age

## 2017-01-04 NOTE — ED Notes (Signed)
Report given to [facility]. ETA 15 mins.

## 2017-01-04 NOTE — ED Triage Notes (Signed)
Pt reports diarrhea since Tuesday. Pt reports her diarrhea is straight "liquid". Pt started taking imodium yesterday about 1 pm. Pt reports nausea also.

## 2017-01-04 NOTE — H&P (Signed)
History and Physical   Tracey Kramer WUJ:[id number] DOB: June 27, 1945 DOA: 01/04/2017  Referring MD/NP/PA: Dr. Silverio Lay, EDP PCP: Andreas Blower., MD Outpatient Specialists: [facility]  Patient coming from: Home by way of [facility]  Chief Complaint: Diarrhea  HPI: Tracey Kramer is a 71 y.o. female with a history of IBS, HTN, HLD, hypothyroidism and osteoarthritis who presented to [facility] for diarrhea and poor per oral intake. She reported 3 days of gradual onset, constant, and worsening watery diarrhea associated with nausea and a couple episodes of emesis, most recently looking clear/yellow. She has been drinking some water but unable to eat anything. Diarrhea has no blood, and has worsened to nearly constant. She denies sick contacts, recent medication changes or antibiotics, or suspicious ingestions, though she did eat pizza at a function the day of onset and feels she's somewhat lactose intolerant. No one else developed these symptoms. She took some imodium without relief and began developing chills, so her husband brought her to the ED.   On arrival she was febrile to 101.60F with largely unremarkable labs, Na 128, Cr 0.88, WBC 9, lactate 1.53. Urine sample <65ml showed bacteria, WBCs, high SpGrav and ketones without glucosuria. CT abd/pelvis demonstrated thickening of the ascending, transverse, descending and rectosigmoid colon walls without focal inflammation/perforation or other obvious abnormalities. Cultures were drawn, IV fluids and levaquin/flagyl given, and GI pathogen panel/CDiff panel sent.   ROS:  + arthralgias which are chronic + decreased UOP No headache, cough, sore throat, chest pain, palpitations, shortness of breath, blood in stool, dysuria, myalgias, rash, and per HPI. All others reviewed and are negative.   Past Medical History:  Diagnosis Date  . Acid reflux occasional  . Acute lateral meniscal tear LEFT KNEE  . Arthritis   . Constipation   . Heart murmur   . Heart palpitations   .  Hyperlipidemia   . Hypertension   . PONV (postoperative nausea and vomiting)   . Thyroid disease    hypothyroidism    Past Surgical History:  Procedure Laterality Date  . CHOLECYSTECTOMY  2002  . DILATION AND CURETTAGE OF UTERUS  1974  . KNEE ARTHROSCOPY  06/27/2011   Procedure: ARTHROSCOPY KNEE;  Surgeon: Loanne Drilling, MD;  Location: [facility];  Service: Orthopedics;  Laterality: Left;  LEFT KNEE SCOPE WITH lateral meniscal DEBRIDEMENT    . TONSILLECTOMY AND ADENOIDECTOMY  AS CHILD  . VAGINAL HYSTERECTOMY  1976   - Never smoker, no EtOH or drugs. Moved [place] retirement community with her husband recently and loves it. She walks her dog several miles daily.   Allergies  Allergen Reactions  . Amoxicillin Rash    Has patient had a PCN reaction causing immediate rash, facial/tongue/throat swelling, SOB or lightheadedness with hypotensionNO Has patient had a PCN reaction causing severe rash involving mucus membranes or skin necrosis: NO Has patient had a PCN reaction that required hospitalization: NO Has patient had a PCN reaction occurring within the last 10 years:NO If all of the above answers are "NO", then may proceed with Cephalosporin use.    - Son had benign colonic polyps, though no one has IBD or liver disease.  - Family history otherwise reviewed and not pertinent.  Prior to Admission medications   Medication Sig Start Date End Date Taking? Authorizing Provider  aspirin 81 MG tablet Take 81 mg by mouth daily.   Yes [provider]  cetirizine (ZYRTEC) 10 MG tablet Take 10 mg by mouth daily.   Yes  [provider]  estradiol (ESTRACE) 0.5 MG tablet Take 0.5 mg by mouth daily.   Yes [provider]  furosemide (LASIX) 20 MG tablet Take 20 mg by mouth.   Yes [provider]  hydrochlorothiazide (HYDRODIURIL) 12.5 MG tablet Take 12.5 mg by mouth daily.   Yes [provider]  ibuprofen (ADVIL,MOTRIN) 200 MG tablet  Take 200 mg by mouth every 6 (six) hours as needed.   Yes [provider]  levothyroxine (SYNTHROID, LEVOTHROID) 88 MCG tablet Take 88 mcg by mouth every evening.   Yes [provider]  omeprazole (PRILOSEC) 20 MG capsule Take 20 mg by mouth as needed.   Yes [provider]  polyethylene glycol (MIRALAX / GLYCOLAX) packet Take 17 g by mouth daily.   Yes [provider]  simvastatin (ZOCOR) 20 MG tablet Take 20 mg by mouth every evening.   Yes [provider]  estradiol (VIVELLE-DOT) 0.05 MG/24HR Place 1 patch onto the skin once a week.    [provider]    Physical Exam: Vitals:   01/04/17 1541 01/04/17 1600 01/04/17 1618 01/04/17 1725  BP: (!) 116/51 (!) 120/46 (!) 115/48 (!) 127/43  Pulse: 72 73 76 74  Resp: 18 12 16 18   Temp:   98.4 F (36.9 C) 98.5 F (36.9 C)  TempSrc:   Oral Oral  SpO2: 99% 97% 100% 96%  Weight:      Height:       Constitutional: 71 y.o. female in no distress, calm demeanor Eyes: Lids and conjunctivae normal, PERRL ENMT: Mucous membranes are tacky. Posterior pharynx clear of any exudate or lesions. normal dentition.  Neck: normal, supple, no masses, no thyromegaly Respiratory: Non-labored breathing room air without accessory muscle use. Clear breath sounds to auscultation bilaterally Cardiovascular: Regular rate and rhythm, I/VI early systolic murmur at base without rubs, or gallops. No carotid bruits. No JVD. No LE edema. 2+ pedal pulses. Abdomen: Hyperactive bowel sounds, mild left sided tenderness without rebound or guarding, no distention. No masses palpated. No hepatosplenomegaly. GU: No indwelling catheter Musculoskeletal: No clubbing / cyanosis. No joint deformity upper and lower extremities. Knees with effusion bilaterally without erythema or warmth. Good ROM, no contractures. Normal muscle tone.  Skin: Warm, dry. No rashes, wounds, no jaundice or ulcers.   Neurologic: CN II-XII grossly intact. Gait  not assessed. Speech normal. No focal deficits in motor strength or sensation in all extremities.  Psychiatric: Alert and oriented x3. Normal judgment and insight. Mood euthymic with broad affect.   Labs on Admission: I have personally reviewed following labs and imaging studies  CBC:  Recent Labs Lab 01/04/17 1000  WBC 9.0  NEUTROABS 7.2  HGB 14.0  HCT 40.3  MCV 87.4  PLT 243   Basic Metabolic Panel:  Recent Labs Lab 01/04/17 1000  NA 128*  K 3.9  CL 94*  CO2 24  GLUCOSE 124*  BUN 20  CREATININE 0.88  CALCIUM 8.7*   GFR: Estimated Creatinine Clearance: 47.8 mL/min (by C-G formula based on SCr of 0.88 mg/dL). Liver Function Tests:  Recent Labs Lab 01/04/17 1000  AST 29  ALT 27  ALKPHOS 63  BILITOT 1.1  PROT 7.0  ALBUMIN 3.7   Urine analysis:    Component Value Date/Time   COLORURINE YELLOW 01/04/2017 1000   APPEARANCEUR CLOUDY (A) 01/04/2017 1000   LABSPEC >1.030 (H) 01/04/2017 1000   PHURINE 6.0 01/04/2017 1000   GLUCOSEU NEGATIVE 01/04/2017 1000   HGBUR SMALL (A) 01/04/2017 1000  BILIRUBINUR SMALL (A) 01/04/2017 1000   KETONESUR 15 (A) 01/04/2017 1000   PROTEINUR 100 (A) 01/04/2017 1000   NITRITE NEGATIVE 01/04/2017 1000   LEUKOCYTESUR SMALL (A) 01/04/2017 1000   Radiological Exams on Admission: Dg Chest 2 View  Result Date: 01/04/2017 CLINICAL DATA:  Fever. EXAM: CHEST  2 VIEW COMPARISON:  None. FINDINGS: The heart size and mediastinal contours are within normal limits. Both lungs are clear. No pneumothorax or pleural effusion is noted. The visualized skeletal structures are unremarkable. IMPRESSION: No active cardiopulmonary disease. Electronically Signed   By: Lupita Raider, M.D.   On: 01/04/2017 11:14   Ct Abdomen Pelvis W Contrast  Result Date: 01/04/2017 CLINICAL DATA:  Nausea and diarrhea for 3 days. EXAM: CT ABDOMEN AND PELVIS WITH CONTRAST TECHNIQUE: Multidetector CT imaging of the abdomen and pelvis was performed using the standard  protocol following bolus administration of intravenous contrast. CONTRAST:  ISOVUE-300 IOPAMIDOL (ISOVUE-300) INJECTION 61% COMPARISON:  None. FINDINGS: Lower chest: Lung bases are clear. Hepatobiliary: No focal hepatic lesion. Postcholecystectomy. No biliary dilatation. Pancreas: The pancreas head and uncinate normal. No pancreatic duct dilatation. In the body pancreas, there is low-attenuation cystic change over approximately 1.5 cm region (image 25, series 2). No mass lesion present. No duct dilatation. Tail the pancreas normal. Spleen: Normal spleen Adrenals/urinary tract: Adrenal glands and kidneys are normal. The ureters and bladder normal. Stomach/Bowel: Stomach, duodenum, small-bowel are normal. Terminal ileum is normal. Appendix not identified. There is submucosal edema involving the ascending, transverse, and descending colon as well as the rectosigmoid colon. Small amount fluid along the descending colon pericolic gutter (image 60, series 2). The bowel wall thickening is seen to 6 mm in the descending colon for example on image 60, series 2. Submucosal mucosal edema in the ascending colon is exampled image 40, series 5. Trace amount free fluid along the sigmoid colon in the pelvis (image 73, series 2). No focal lesion. No diverticulosis. No fistula or abscess. Vascular/Lymphatic: Abdominal aorta is normal caliber with atherosclerotic calcification. Mesenteric vessels patent. There is no retroperitoneal or periportal lymphadenopathy. No pelvic lymphadenopathy. Reproductive: Post hysterectomy Other: No peritoneal abnormality.  Small volume free fluid Musculoskeletal: No aggressive osseous lesion. Normal sacroiliac joints. IMPRESSION: 1. Pancolitis with submucosal edema involving the entirety of the colon. Differential would include infectious colitis, inflammatory bowel disease, drug reaction, and less likely ischemic colitis. Mesenteric vessels are patent. 2. Subtle low attenuation within the mid  body the pancreas is favored benign ductal ectasia. Recommend follow-up contrast MRI in 6 months per consensus criteria. This recommendation follows ACR consensus guidelines: Management of Incidental Pancreatic Cysts: A White Paper of the ACR Incidental Findings Committee. J Am Coll Radiol 2017;14:911-923. Electronically Signed   By: Genevive Bi M.D.   On: 01/04/2017 11:36    EKG: Independently reviewed. NSR, mild T wave flattening in anterior precordial leads. Normal axis, intervals.   Assessment/Plan Principal Problem:   Pancolitis (HCC) Active Problems:   Hyperlipidemia   Hypertension   Hypothyroidism   Hyponatremia   Dehydration   Pancolitis: Pattern not consistent with ischemic bowel and not consistent demographics for IBD. Presume infectious, with work up still pending. No obvious risk factors for C. diff. Had negative colonoscopy 2012.  - Continue levaquin/flagyl - Follow up GI pathogen panel and C. diff PCR - Empiric contact precautions - IVF's provided and will continue given poor per oral intake.  - Advance diet - Zofran prn (QTc wnl) - Antipyretics as needed - Monitor blood cultures  Dehydration: Due to colitis - IVF - Monitor metabolic panel  Hyponatremia: Due to poor solute intake.  - Monitor  Essential HTN:  - Hold lasix, HCTZ, triamterene while low-normal BPs  Hyperlipidemia: Chronic, stable - Continue statin. LFTs wnl.   Hypothyroidism: Chronic, stable.  - Continue home dose synthroid.   Pyuria: Asymptomatic.  - Monitor urine culture  DVT prophylaxis: Lovenox  Code Status: Full  Family Communication: None at bedside Disposition Plan: Anticipate DC home once taking po Consults called: None  Admission status: Observation    Hazeline Junker, MD [facility] Pager [phone]  If 7PM-7AM, please contact night-coverage [email or web address] Password [email or web address] 01/04/2017, 6:22 PM

## 2017-01-04 NOTE — ED Notes (Signed)
Patient transported to CT 

## 2017-01-04 NOTE — Progress Notes (Signed)
Pharmacy Antibiotic Note  Tracey Kramer is a 71 y.o. female admitted on 01/04/2017 with intra-abdominal infection.  Pharmacy has been consulted for levofloxacin and Flagyl dosing.  [facility].  Plan: Levofloxacin 500mg  IV q24h Flagyl 500mg  IV q8h Follow c/s, clinical progression, de-escalation/LOT, renal function  Height: 5\' 3"  (160 cm) Weight: 135 lb (61.2 kg) IBW/kg (Calculated) : 52.4  Temp (24hrs), Avg:101.2 F (38.4 C), Min:101.2 F (38.4 C), Max:101.2 F (38.4 C)  No results for input(s): WBC, CREATININE, LATICACIDVEN, VANCOTROUGH, VANCOPEAK, VANCORANDOM, GENTTROUGH, GENTPEAK, GENTRANDOM, TOBRATROUGH, TOBRAPEAK, TOBRARND, AMIKACINPEAK, AMIKACINTROU, AMIKACIN in the last 168 hours.  CrCl cannot be calculated (No order found.).    Allergies  Allergen Reactions  . Amoxicillin Rash    Antimicrobials this admission: LVQ 10/19 >>  Flagyl 10/19 >>   Dose adjustments this admission: n/a  Microbiology results: 10/19 BCx:  10/1+9 UCx:   10/19 CDiff:   Thank you for allowing pharmacy to be a part of this patient's care.  Evonna Stoltz D. Lowanda Cashaw, PharmD, BCPS Clinical Pharmacist Pager: [phone] Clinical Phone for 01/04/2017 until 3:30pm: [phone] If after 3:30pm, please call main pharmacy at [phone] 01/04/2017 10:16 AM

## 2017-01-05 ENCOUNTER — Telehealth (HOSPITAL_BASED_OUTPATIENT_CLINIC_OR_DEPARTMENT_OTHER): Payer: Self-pay | Admitting: *Deleted

## 2017-01-05 DIAGNOSIS — K5100 Ulcerative (chronic) pancolitis without complications: Secondary | ICD-10-CM | POA: Diagnosis not present

## 2017-01-05 LAB — BASIC METABOLIC PANEL
ANION GAP: 8 (ref 5–15)
BUN: 11 mg/dL (ref 6–20)
CHLORIDE: 106 mmol/L (ref 101–111)
CO2: 22 mmol/L (ref 22–32)
Calcium: 8 mg/dL — ABNORMAL LOW (ref 8.9–10.3)
Creatinine, Ser: 0.7 mg/dL (ref 0.44–1.00)
Glucose, Bld: 96 mg/dL (ref 65–99)
Potassium: 3.2 mmol/L — ABNORMAL LOW (ref 3.5–5.1)
Sodium: 136 mmol/L (ref 135–145)

## 2017-01-05 LAB — GASTROINTESTINAL PANEL BY PCR, STOOL (REPLACES STOOL CULTURE)
Adenovirus F40/41: NOT DETECTED
Astrovirus: NOT DETECTED
CRYPTOSPORIDIUM: NOT DETECTED
CYCLOSPORA CAYETANENSIS: NOT DETECTED
Campylobacter species: NOT DETECTED
ENTEROAGGREGATIVE E COLI (EAEC): NOT DETECTED
Entamoeba histolytica: NOT DETECTED
Enteropathogenic E coli (EPEC): NOT DETECTED
Enterotoxigenic E coli (ETEC): NOT DETECTED
GIARDIA LAMBLIA: NOT DETECTED
Norovirus GI/GII: NOT DETECTED
Plesimonas shigelloides: NOT DETECTED
Rotavirus A: NOT DETECTED
SALMONELLA SPECIES: DETECTED — AB
SAPOVIRUS (I, II, IV, AND V): NOT DETECTED
Shiga like toxin producing E coli (STEC): NOT DETECTED
Shigella/Enteroinvasive E coli (EIEC): NOT DETECTED
VIBRIO SPECIES: NOT DETECTED
Vibrio cholerae: NOT DETECTED
YERSINIA ENTEROCOLITICA: NOT DETECTED

## 2017-01-05 LAB — URINE CULTURE: Culture: <10000 — AB

## 2017-01-05 LAB — C DIFFICILE QUICK SCREEN W PCR REFLEX
C DIFFICLE (CDIFF) ANTIGEN: NEGATIVE
C Diff interpretation: NOT DETECTED
C Diff toxin: NEGATIVE

## 2017-01-05 MED ORDER — KCL IN DEXTROSE-NACL 20-5-0.45 MEQ/L-%-% IV SOLN
INTRAVENOUS | Status: DC
  Administered 2017-01-05 (×2): via INTRAVENOUS
  Filled 2017-01-05 (×3): qty 1000

## 2017-01-05 NOTE — Progress Notes (Signed)
PROGRESS NOTE  Brief Narrative: Tracey Kramer is a 71 y.o. female with a history of IBS, HTN, HLD, hypothyroidism and osteoarthritis who presented to [facility] for diarrhea and poor per oral intake worsenign for 3 days with associated nausea and vomiting. On arrival she was febrile to 101.54F, WBC 9, CT abd/pelvis demonstrated thickening of the ascending, transverse, descending and rectosigmoid colon walls without focal inflammation/perforation or other obvious abnormalities. Cultures were drawn, IV fluids and levaquin/flagyl given. C. diff assay was negative.   Subjective:  No significant improvement in diarrhea frequency, still watery and ~1x/hr. Had some grits this AM but had emesis with overzealous intake at dinner last night.   Objective: BP (!) 108/46 (BP Location: Right Arm)   Pulse 65   Temp 98.1 F (36.7 C) (Oral)   Resp 18   Ht 5\' 3"  (1.6 m)   Wt 61.2 kg (135 lb)   SpO2 96%   BMI 23.91 kg/m   Gen: WDWN 71yo F in no distress Pulm: Clear and nonlabored on room air  CV: RRR, no murmur, no JVD, no edema GI: Soft, left abdominal tenderness without rebound or guarding. Not distended, normoactive bowel sounds.   Neuro: Alert and oriented. No focal deficits. Skin: No rashes, lesions no ulcers  Assessment & Plan: Pancolitis: Pattern not consistent with ischemic bowel and not consistent demographics for IBD. Presume infectious, C. diff negative with GI panel pending. No obvious risk factors for C. diff. Had negative colonoscopy 2012.  - Continue levaquin/flagyl - Follow up GI pathogen panel - Empiric contact precautions - Zofran prn (QTc wnl), IVF's, advance diet as tolerated. - Antipyretics as needed - Monitor blood cultures (NGTD)  Dehydration with hyponatremia: Due to colitis. Na normalized. - Change IVF to maintenance D5 1/2NS w/8420mEq KCl.  - Monitor metabolic panel  Hypokalemia: GI losses.  - Replace in IVF's, recheck in AM  Essential HTN:  - Holding lasix, HCTZ, triamterene  while low-normal BPs  Hyperlipidemia: Chronic, stable - Continue statin. LFTs wnl.   Hypothyroidism: Chronic, stable.  - Continue home dose synthroid.   Pyuria: Asymptomatic, negative urine culture.  Hazeline Junkeryan Jawaan Adachi, MD [facility] Pager [phone] 01/05/2017, 6:55 PM

## 2017-01-06 DIAGNOSIS — A020 Salmonella enteritis: Secondary | ICD-10-CM | POA: Diagnosis not present

## 2017-01-06 DIAGNOSIS — K5100 Ulcerative (chronic) pancolitis without complications: Secondary | ICD-10-CM | POA: Diagnosis not present

## 2017-01-06 DIAGNOSIS — E860 Dehydration: Secondary | ICD-10-CM | POA: Diagnosis not present

## 2017-01-06 LAB — BASIC METABOLIC PANEL
ANION GAP: 6 (ref 5–15)
BUN: <5 mg/dL — ABNORMAL LOW (ref 6–20)
CO2: 24 mmol/L (ref 22–32)
Calcium: 8.3 mg/dL — ABNORMAL LOW (ref 8.9–10.3)
Chloride: 109 mmol/L (ref 101–111)
Creatinine, Ser: 0.66 mg/dL (ref 0.44–1.00)
Glucose, Bld: 127 mg/dL — ABNORMAL HIGH (ref 65–99)
POTASSIUM: 3.4 mmol/L — AB (ref 3.5–5.1)
Sodium: 139 mmol/L (ref 135–145)

## 2017-01-06 LAB — MAGNESIUM: MAGNESIUM: 1.7 mg/dL (ref 1.7–2.4)

## 2017-01-06 MED ORDER — LEVOFLOXACIN 500 MG PO TABS: 500.0000 mg | ORAL_TABLET | Freq: Every day | ORAL | 0 refills | Status: DC

## 2017-01-06 NOTE — Progress Notes (Signed)
Pt leaving this afternoon with her spouse. Alert, oriented, and without c/o. Discharge instructions/prescription given/explained with pt verbalizing understanding.  Followup noted.

## 2017-01-06 NOTE — Discharge Instructions (Signed)
Salmonella Gastroenteritis, Adult °Salmonella gastroenteritis is an infection of the intestines that can cause nausea, vomiting, and other symptoms. The infection usually lasts from 2 to 7 days. °What are the causes? °This condition is caused by salmonella bacteria. These bacteria can spread through a person's stool. You can get this infection by: °· Eating food or drinking liquids that have the bacteria on it. °· Drinking polluted, standing water. °· Coming into contact with an animal that is carrying the bacteria. ° °What increases the risk? °This condition is more likely to develop in: °· Elderly adults. °· People with a weakened immune system. °· People with poor personal or kitchen hygiene. ° °What are the signs or symptoms? °Symptoms of this condition include: °· Nausea. °· Vomiting. °· Abdominal pain or cramps. °· Diarrhea, which may be bloody. °· Fever. °· A headache. ° °How is this diagnosed? °This condition may be diagnosed based on your medical history, a physical exam, and a blood or stool test. °How is this treated? °Often, the only treatment needed is to drink plenty of fluids. Drinking fluids is important because this infection can make you dehydrated. If your condition is severe, you may be prescribed an antibiotic medicine to help shorten the illness. °Most people recover completely, but some people may develop lasting problems such as arthritis, irritation of the eyes, or painful urination. °Follow these instructions at home: °Drinking °· Drink enough fluid to keep your urine clear or pale yellow. This helps prevent dehydration. °· Until your diarrhea, nausea, or vomiting is under control, drink only clear liquids. Clear liquids are liquids you can see through, such as water, broth, or non-caffeinated tea. Avoid milk, fruit juice, alcohol, and very hot or very cold drinks. °· If you are dehydrated, ask your health care provider for specific rehydration instructions. Signs of dehydration  include: °? Thirst. °? Dry lips or mouth. °? Dry, warm skin. °? Dark urine. °? A headache. °Eating and drinking °· If you are not hungry, do not force yourself to eat. °· If you are hungry, eat a well-balanced diet that includes: °? A variety of complex carbohydrates, such as rice, wheat, potatoes, and bread. °? Lean meats. °? Yogurt. °? Fruits. °? Vegetables. °· Avoid high-fat foods. They are difficult to digest. °· If you have diarrhea, do not prepare food. °Medicines °· Take over-the-counter and prescription medicines only as told by your health care provider. °· If you were prescribed an antibiotic medicine, take it as told by your health care provider. Do not stop taking the antibiotic even if you start to feel better. °Other Instructions ° °· Wash your hands well. This helps keep the bacteria from spreading to others. °· Keep track of changes in your weight. Losing a lot of weight can be a sign of a serious problem. Ask your health care provider how much weight loss should concern you. °· Keep all follow-up visits as told by your health care provider. °How is this prevented? °To prevent future salmonella infections: °· Handle meat, eggs, seafood, and poultry properly. °· Always cook meat, eggs, seafood, and poultry thoroughly. °· Wash your hands and counters thoroughly after handling or preparing meat, eggs, seafood, and poultry. °· Wash your hands thoroughly after handling animals. ° °Get help right away if: °· You cannot keep fluids down. °· You keep vomiting or having diarrhea. °· You have abdominal pain that gets worse. °· You have abdominal pain in one small area. °· Your diarrhea has more blood or mucus than   before. °· You have a fever. °· You have any symptoms of severe dehydration. These include: °? Extreme thirst. °? Confusion. °? Inability to sweat. °? Fainting. °? Dizziness. °? Weight loss. °This information is not intended to replace advice given to you by your health care provider. Make sure you  discuss any questions you have with your health care provider. °Document Released: 03/02/2000 Document Revised: 08/11/2015 Document Reviewed: 11/27/2014 °Elsevier Interactive Patient Education © 2018 [employer] ° °

## 2017-01-06 NOTE — Discharge Summary (Signed)
Physician Discharge Summary  Tracey Kramer ZOX:[id number] DOB: April 14, 1945 DOA: 01/04/2017  PCP: Tracey Blower., MD  Admit date: 01/04/2017 Discharge date: 01/06/2017  Admitted From: Home Disposition: Home   Recommendations for Outpatient Follow-up:  1. Follow up with PCP in 1-2 weeks  Home Health: None Equipment/Devices: None Discharge Condition: Stable CODE STATUS: Full Diet recommendation: Regular  Brief/Interim Summary: Tracey Kramer a 71 y.o.femalewith a history of IBS, HTN, HLD, hypothyroidism and osteoarthritis who presented to [facility] for diarrhea and poor per oral intake worsenign for 3 days with associated nausea and vomiting. On arrival she was febrile to 101.79F, WBC 9, CT abd/pelvis demonstrated thickening of the ascending, transverse, descending and rectosigmoid colon walls without focal inflammation/perforation or other obvious abnormalities. Cultures were drawn, IV fluids and levaquin/flagyl given. C. diff assay was negative but pathogen panel demonstrated salmonella species. Leavquin was continued and the patient's symptoms subsided on 10/21.   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  - Continue levaquin x7 days due to severity of symptoms requiring hospitalization. Warned about asymptomatic carriage and appropriate infection control precautions.  - Taking po fine, so will DC IV fluids and discharge patient.   Dehydration with hyponatremia: Due to colitis. Na normalized. - Resolved  Hypokalemia: GI losses, improving. Anticipate continued resolution with improved po intake.   Essential HTN:  - Restart home medications beginning tomorrow.   Hyperlipidemia: Chronic, stable - Continue statin. LFTs wnl.   Hypothyroidism: Chronic, stable.  - Continue home dose synthroid.   Pyuria:  Asymptomatic, negative urine culture.  Discharge Instructions Discharge Instructions    Discharge instructions    Complete by:  As directed    Continue taking leavquin once daily for the next 4 days (starting tomorrow). This was sent to your pharmacy.   Everyone should practice good hand hygiene. Your symptoms should continue to improve. If they do not or you become unable to maintain hydration, seek medical attention right away. Otherwise follow up with your PCP as needed.     Allergies as of 01/06/2017      Reactions   Amoxicillin Rash   Has patient had a PCN reaction causing immediate rash, facial/tongue/throat swelling, SOB or lightheadedness with hypotensionNO Has patient had a PCN reaction causing severe rash involving mucus membranes or skin necrosis: NO Has patient had a PCN reaction that required hospitalization: NO Has patient had a PCN reaction occurring within the last 10 years:NO If all of the above answers are "NO", then may proceed with Cephalosporin use.      Medication List    TAKE these medications   aspirin 81 MG tablet Take 81 mg by mouth daily.   cetirizine 10 MG tablet Commonly known as:  ZYRTEC Take 10 mg by mouth daily.   estradiol 0.5 MG tablet Commonly known as:  ESTRACE Take 0.5 mg by mouth daily.   estradiol 0.05 MG/24HR patch Commonly known as:  VIVELLE-DOT Place 1 patch onto the skin once a week.   furosemide 20 MG tablet Commonly known as:  LASIX Take 20 mg by mouth.   ibuprofen 200 MG tablet Commonly known as:  ADVIL,MOTRIN Take 200 mg by mouth every 6 (six) hours as needed.   levofloxacin 500 MG tablet Commonly known as:  LEVAQUIN Take 1 tablet (500 mg total) by mouth daily.   levothyroxine 88 MCG tablet Commonly known  as:  SYNTHROID, LEVOTHROID Take 88 mcg by mouth every evening.   meloxicam 15 MG tablet Commonly known as:  MOBIC Take 15 mg by mouth daily.   omeprazole 20 MG capsule Commonly known as:  PRILOSEC Take 20 mg  by mouth as needed.   polyethylene glycol packet Commonly known as:  MIRALAX / GLYCOLAX Take 17 g by mouth daily.   simvastatin 20 MG tablet Commonly known as:  ZOCOR Take 20 mg by mouth every evening.   triamterene-hydrochlorothiazide 37.5-25 MG capsule Commonly known as:  DYAZIDE Take 1 capsule by mouth daily.      Follow-up Information    Tracey Kramer, Tracey M., MD Follow up.   Specialty:  Internal Medicine Contact information: [address] [place] [id number] [phone]          Allergies  Allergen Reactions  . Amoxicillin Rash    Has patient had a PCN reaction causing immediate rash, facial/tongue/throat swelling, SOB or lightheadedness with hypotensionNO Has patient had a PCN reaction causing severe rash involving mucus membranes or skin necrosis: NO Has patient had a PCN reaction that required hospitalization: NO Has patient had a PCN reaction occurring within the last 10 years:NO If all of the above answers are "NO", then may proceed with Cephalosporin use.     Consultations:  None  Procedures/Studies: Dg Chest 2 View  Result Date: 01/04/2017 CLINICAL DATA:  Fever. EXAM: CHEST  2 VIEW COMPARISON:  None. FINDINGS: The heart size and mediastinal contours are within normal limits. Both lungs are clear. No pneumothorax or pleural effusion is noted. The visualized skeletal structures are unremarkable. IMPRESSION: No active cardiopulmonary disease. Electronically Signed   By: Lupita RaiderJames  Green Jr, KramerD.   On: 01/04/2017 11:14   Ct Abdomen Pelvis W Contrast  Result Date: 01/04/2017 CLINICAL DATA:  Nausea and diarrhea for 3 days. EXAM: CT ABDOMEN AND PELVIS WITH CONTRAST TECHNIQUE: Multidetector CT imaging of the abdomen and pelvis was performed using the standard protocol following bolus administration of intravenous contrast. CONTRAST:  100mL ISOVUE-300 IOPAMIDOL (ISOVUE-300) INJECTION 61% COMPARISON:  None. FINDINGS: Lower chest: Lung bases are clear.  Hepatobiliary: No focal hepatic lesion. Postcholecystectomy. No biliary dilatation. Pancreas: The pancreas head and uncinate normal. No pancreatic duct dilatation. In the body pancreas, there is low-attenuation cystic change over approximately 1.5 cm region (image 25, series 2). No mass lesion present. No duct dilatation. Tail the pancreas normal. Spleen: Normal spleen Adrenals/urinary tract: Adrenal glands and kidneys are normal. The ureters and bladder normal. Stomach/Bowel: Stomach, duodenum, small-bowel are normal. Terminal ileum is normal. Appendix not identified. There is submucosal edema involving the ascending, transverse, and descending colon as well as the rectosigmoid colon. Small amount fluid along the descending colon pericolic gutter (image 60, series 2). The bowel wall thickening is seen to 6 mm in the descending colon for example on image 60, series 2. Submucosal mucosal edema in the ascending colon is exampled image 40, series 5. Trace amount free fluid along the sigmoid colon in the pelvis (image 73, series 2). No focal lesion. No diverticulosis. No fistula or abscess. Vascular/Lymphatic: Abdominal aorta is normal caliber with atherosclerotic calcification. Mesenteric vessels patent. There is no retroperitoneal or periportal lymphadenopathy. No pelvic lymphadenopathy. Reproductive: Post hysterectomy Other: No peritoneal abnormality.  Small volume free fluid Musculoskeletal: No aggressive osseous lesion. Normal sacroiliac joints. IMPRESSION: 1. Pancolitis with submucosal edema involving the entirety of the colon. Differential would include infectious colitis, inflammatory bowel disease, drug reaction, and less likely ischemic colitis.  Mesenteric vessels are patent. 2. Subtle low attenuation within the mid body the pancreas is favored benign ductal ectasia. Recommend follow-up contrast MRI in 6 months per consensus criteria. This recommendation follows ACR consensus guidelines: Management of  Incidental Pancreatic Cysts: A White Paper of the ACR Incidental Findings Committee. J Am Coll Radiol 2017;14:911-923. Electronically Signed   By: Genevive Bi KramerD.   On: 01/04/2017 11:36      Subjective: Feels much better. Diarrhea about twice in past 12 hours. Ate full breakfast and lunch without nausea or vomiting.   Discharge Exam: BP (!) 117/42 (BP Location: Right Arm)   Pulse 65   Temp 98.2 F (36.8 C) (Oral)   Resp 18   Ht 5\' 3"  (1.6 m)   Wt 61.2 kg (135 lb)   SpO2 97%   BMI 23.91 kg/m   General: Pt is alert, awake, not in acute distress Cardiovascular: RRR, S1/S2 +, no rubs, no gallops Respiratory: CTA bilaterally, no wheezing, no rhonchi Abdominal: Soft, NT, ND, bowel sounds + Extremities: No edema, no cyanosis  Labs: Basic Metabolic Panel:  Recent Labs Lab 01/04/17 1000 01/05/17 0649 01/06/17 0622  NA 128* 136 139  K 3.9 3.2* 3.4*  CL 94* 106 109  CO2 24 22 24   GLUCOSE 124* 96 127*  BUN 20 11 <5*  CREATININE 0.88 0.70 0.66  CALCIUM 8.7* 8.0* 8.3*  MG  --   --  1.7   Liver Function Tests:  Recent Labs Lab 01/04/17 1000  AST 29  ALT 27  ALKPHOS 63  BILITOT 1.1  PROT 7.0  ALBUMIN 3.7   CBC:  Recent Labs Lab 01/04/17 1000  WBC 9.0  NEUTROABS 7.2  HGB 14.0  HCT 40.3  MCV 87.4  PLT 243   Urinalysis    Component Value Date/Time   COLORURINE YELLOW 01/04/2017 1000   APPEARANCEUR CLOUDY (A) 01/04/2017 1000   LABSPEC >1.030 (H) 01/04/2017 1000   PHURINE 6.0 01/04/2017 1000   GLUCOSEU NEGATIVE 01/04/2017 1000   HGBUR SMALL (A) 01/04/2017 1000   BILIRUBINUR SMALL (A) 01/04/2017 1000   KETONESUR 15 (A) 01/04/2017 1000   PROTEINUR 100 (A) 01/04/2017 1000   NITRITE NEGATIVE 01/04/2017 1000   LEUKOCYTESUR SMALL (A) 01/04/2017 1000    Microbiology Recent Results (from the past 240 hour(s))  Blood Culture (routine x 2)     Status: None (Preliminary result)   Collection Time: 01/04/17 10:00 AM  Result Value Ref Range Status   Specimen  Description BLOOD LEFT ARM  Final   Special Requests IN PEDIATRIC BOTTLE Blood Culture adequate volume  Final   Culture   Final    NO GROWTH < 24 HOURS Performed at [facility], [address], [place], [place] [address]    Report Status PENDING  Incomplete  Urine culture     Status: Abnormal   Collection Time: 01/04/17 10:00 AM  Result Value Ref Range Status   Specimen Description URINE, RANDOM  Final   Special Requests NONE  Final   Culture (A)  Final    <10,000 COLONIES/mL INSIGNIFICANT GROWTH Performed at [facility], [address], [place], [place] [address]    Report Status 01/05/2017 FINAL  Final  Blood Culture (routine x 2)     Status: None (Preliminary result)   Collection Time: 01/04/17 10:17 AM  Result Value Ref Range Status   Specimen Description BLOOD RIGHT ARM  Final   Special Requests   Final    BOTTLES DRAWN  AEROBIC AND ANAEROBIC Blood Culture adequate volume   Culture   Final    NO GROWTH < 24 HOURS Performed at [facility], [address], [place], [place] [address]    Report Status PENDING  Incomplete  Gastrointestinal Panel by PCR , Stool     Status: Abnormal   Collection Time: 01/04/17 10:21 AM  Result Value Ref Range Status   Campylobacter species NOT DETECTED NOT DETECTED Final   Plesimonas shigelloides NOT DETECTED NOT DETECTED Final   Salmonella species DETECTED (A) NOT DETECTED Final    Comment: RESULT CALLED TO, READ BACK BY AND VERIFIED WITH: JON COOK AT 1930 ON 01/05/17 RWW    Yersinia enterocolitica NOT DETECTED NOT DETECTED Final   Vibrio species NOT DETECTED NOT DETECTED Final   Vibrio cholerae NOT DETECTED NOT DETECTED Final   Enteroaggregative E coli (EAEC) NOT DETECTED NOT DETECTED Final   Enteropathogenic E coli (EPEC) NOT DETECTED NOT DETECTED Final   Enterotoxigenic E coli (ETEC) NOT DETECTED NOT DETECTED Final   Shiga like toxin producing E coli (STEC) NOT DETECTED NOT DETECTED Final   Shigella/Enteroinvasive E  coli (EIEC) NOT DETECTED NOT DETECTED Final   Cryptosporidium NOT DETECTED NOT DETECTED Final   Cyclospora cayetanensis NOT DETECTED NOT DETECTED Final   Entamoeba histolytica NOT DETECTED NOT DETECTED Final   Giardia lamblia NOT DETECTED NOT DETECTED Final   Adenovirus F40/41 NOT DETECTED NOT DETECTED Final   Astrovirus NOT DETECTED NOT DETECTED Final   Norovirus GI/GII NOT DETECTED NOT DETECTED Final   Rotavirus A NOT DETECTED NOT DETECTED Final   Sapovirus (I, II, IV, and V) NOT DETECTED NOT DETECTED Final  C difficile quick scan w PCR reflex     Status: None   Collection Time: 01/04/17 10:21 AM  Result Value Ref Range Status   C Diff antigen NEGATIVE NEGATIVE Final   C Diff toxin NEGATIVE NEGATIVE Final   C Diff interpretation No C. difficile detected.  Final    Comment: Performed at [facility], [address], [place], [place] [address]    Time coordinating discharge: Approximately 40 minutes  Hazeline Junker, MD  [facility] 01/06/2017, 1:07 PM Pager [phone]

## 2017-01-09 LAB — CULTURE, BLOOD (ROUTINE X 2)
Culture: NO GROWTH
Culture: NO GROWTH
SPECIAL REQUESTS: ADEQUATE
Special Requests: ADEQUATE

## 2017-11-06 DIAGNOSIS — I351 Nonrheumatic aortic (valve) insufficiency: Secondary | ICD-10-CM

## 2017-11-06 DIAGNOSIS — I340 Nonrheumatic mitral (valve) insufficiency: Secondary | ICD-10-CM

## 2017-11-06 HISTORY — DX: Nonrheumatic aortic (valve) insufficiency: I35.1

## 2017-11-06 HISTORY — DX: Nonrheumatic mitral (valve) insufficiency: I34.0

## 2017-11-11 DIAGNOSIS — I371 Nonrheumatic pulmonary valve insufficiency: Secondary | ICD-10-CM

## 2017-11-11 DIAGNOSIS — I071 Rheumatic tricuspid insufficiency: Secondary | ICD-10-CM

## 2017-11-11 HISTORY — DX: Rheumatic tricuspid insufficiency: I07.1

## 2017-11-11 HISTORY — DX: Nonrheumatic pulmonary valve insufficiency: I37.1

## 2017-12-24 NOTE — H&P (Signed)
TOTAL KNEE ADMISSION H&P  Patient is being admitted for left total knee arthroplasty.  Subjective:  Chief Complaint:left knee pain.  HPI: Tracey Kramer, 72 y.o. female, has a history of pain and functional disability in the left knee due to arthritis and has failed non-surgical conservative treatments for greater than 12 weeks to includecorticosteriod injections, viscosupplementation injections and activity modification.  Onset of symptoms was gradual, starting 3 years ago with gradually worsening course since that time. The patient noted prior procedures on the knee to include  arthroscopy on the left knee(s).  Patient currently rates pain in the left knee(s) at 10 out of 10 with activity. Patient has worsening of pain with activity and weight bearing, crepitus, joint swelling and instability.  Patient has evidence of bone-on-bone arthritis lateral and patellofemoral in the LEFT knee by imaging studies. There is no active infection.  Patient Active Problem List   Diagnosis Date Noted  . Colitis 01/04/2017  . Hyperlipidemia 01/04/2017  . Hypertension 01/04/2017  . Hypothyroidism 01/04/2017  . Hyponatremia 01/04/2017  . Dehydration 01/04/2017  . Pancolitis (HCC) 01/04/2017  . Acute lateral meniscal tear 06/27/2011   Past Medical History:  Diagnosis Date  . Acid reflux occasional  . Acute lateral meniscal tear LEFT KNEE  . Arthritis   . Constipation   . Heart murmur   . Heart palpitations   . Hyperlipidemia   . Hypertension   . PONV (postoperative nausea and vomiting)   . Thyroid disease    hypothyroidism     Past Surgical History:  Procedure Laterality Date  . CHOLECYSTECTOMY  2002  . DILATION AND CURETTAGE OF UTERUS  1974  . KNEE ARTHROSCOPY  06/27/2011   Procedure: ARTHROSCOPY KNEE;  Surgeon: Loanne Drilling, MD;  Location: [facility];  Service: Orthopedics;  Laterality: Left;  LEFT KNEE SCOPE WITH lateral meniscal DEBRIDEMENT    . TONSILLECTOMY AND  ADENOIDECTOMY  AS CHILD  . VAGINAL HYSTERECTOMY  1976    No current facility-administered medications for this encounter.    Current Outpatient Medications  Medication Sig Dispense Refill Last Dose  . aspirin 81 MG tablet Take 81 mg by mouth daily.   Past Week at Unknown time  . cetirizine (ZYRTEC) 10 MG tablet Take 10 mg by mouth daily.   01/03/2017 at Unknown time  . estradiol (ESTRACE) 0.5 MG tablet Take 0.5 mg by mouth daily.   01/03/2017 at Unknown time  . estradiol (VIVELLE-DOT) 0.05 MG/24HR Place 1 patch onto the skin once a week.   applied 06/24/11  . furosemide (LASIX) 20 MG tablet Take 20 mg by mouth.   01/03/2017 at Unknown time  . ibuprofen (ADVIL,MOTRIN) 200 MG tablet Take 200 mg by mouth every 6 (six) hours as needed.   Past Week at Unknown time  . levofloxacin (LEVAQUIN) 500 MG tablet Take 1 tablet (500 mg total) by mouth daily. 4 tablet 0   . levothyroxine (SYNTHROID, LEVOTHROID) 88 MCG tablet Take 88 mcg by mouth every evening.   Past Week at Unknown time  . meloxicam (MOBIC) 15 MG tablet Take 15 mg by mouth daily.   01/03/2017 at Unknown time  . omeprazole (PRILOSEC) 20 MG capsule Take 20 mg by mouth as needed.   01/03/2017 at Unknown time  . polyethylene glycol (MIRALAX / GLYCOLAX) packet Take 17 g by mouth daily.   Past Month at Unknown time  . simvastatin (ZOCOR) 20 MG tablet Take 20 mg by mouth every evening.   Past Week at  Unknown time  . triamterene-hydrochlorothiazide (DYAZIDE) 37.5-25 MG capsule Take 1 capsule by mouth daily.   01/03/2017 at Unknown time   Allergies  Allergen Reactions  . Amoxicillin Rash    Has patient had a PCN reaction causing immediate rash, facial/tongue/throat swelling, SOB or lightheadedness with hypotensionNO Has patient had a PCN reaction causing severe rash involving mucus membranes or skin necrosis: NO Has patient had a PCN reaction that required hospitalization: NO Has patient had a PCN reaction occurring within the last 10 years:NO If  all of the above answers are "NO", then may proceed with Cephalosporin use.     Social History   Tobacco Use  . Smoking status: Never Smoker  . Smokeless tobacco: Never Used  Substance Use Topics  . Alcohol use: No    No family history on file.   Review of Systems  Constitutional: Negative for chills and fever.  HENT: Negative for congestion, sore throat and tinnitus.   Eyes: Negative for double vision, photophobia and pain.  Respiratory: Negative for cough, shortness of breath and wheezing.   Cardiovascular: Negative for chest pain, palpitations and orthopnea.  Gastrointestinal: Negative for heartburn, nausea and vomiting.  Genitourinary: Negative for dysuria, frequency and urgency.  Musculoskeletal: Positive for joint pain.  Neurological: Negative for dizziness, weakness and headaches.    Objective:  Physical Exam Well nourished and well developed. General: Alert and oriented x3, cooperative and pleasant, no acute distress. Head: normocephalic, atraumatic, neck supple. Eyes: EOMI. Respiratory: breath sounds clear in all fields, no wheezing, rales, or rhonchi. Cardiovascular: Regular rate and rhythm, no murmurs, gallops or rubs.  Abdomen: non-tender to palpation and soft, normoactive bowel sounds. Musculoskeletal: Gait pattern is antalgic on the LEFT Her LEFT hip has normal range of motion without discomfort.  Her LEFT knee shows no effusion. She has slight valgus deformity. Range of motion is 5-130. She is tender lateral greater than medial.. There is no instability.  RIGHT knee shows no effusion or deformity. Range 0-130. She is slightly tender lateral greater than medial. There is no instability.  Calves soft and nontender. Motor function intact in LE. Strength 5/5 LE bilaterally. Neuro: Distal pulses 2+. Sensation to light touch intact in LE.  Vital signs in last 24 hours: Blood pressure: 132/60 mmHg Pulse: 60 bpm  Labs:   Estimated body mass index is 23.91  kg/m as calculated from the following:   Height as of 01/04/17: 5\' 3"  (1.6 m).   Weight as of 01/04/17: 61.2 kg.   Imaging Review Plain radiographs demonstrate severe degenerative joint disease of the left knee(s). The overall alignment isneutral. The bone quality appears to be adequate for age and reported activity level.   Preoperative templating of the joint replacement has been completed, documented, and submitted to the Operating Room personnel in order to optimize intra-operative equipment management.   Anticipated LOS equal to or greater than 2 midnights due to - Age 71 and older with one or more of the following:  - Obesity  - Expected need for hospital services (PT, OT, Nursing) required for safe  discharge  - Anticipated need for postoperative skilled nursing care or inpatient rehab  - Active co-morbidities: None OR   - Unanticipated findings during/Post Surgery: None  - Patient is a high risk of re-admission due to: None     Assessment/Plan:  End stage arthritis, left knee   The patient history, physical examination, clinical judgment of the provider and imaging studies are consistent with end stage degenerative joint disease  of the left knee(s) and total knee arthroplasty is deemed medically necessary. The treatment options including medical management, injection therapy arthroscopy and arthroplasty were discussed at length. The risks and benefits of total knee arthroplasty were presented and reviewed. The risks due to aseptic loosening, infection, stiffness, patella tracking problems, thromboembolic complications and other imponderables were discussed. The patient acknowledged the explanation, agreed to proceed with the plan and consent was signed. Patient is being admitted for inpatient treatment for surgery, pain control, PT, OT, prophylactic antibiotics, VTE prophylaxis, progressive ambulation and ADL's and discharge planning. The patient is planning to be discharged  home with outpatient physical therapy.  Therapy Plans: outpatient therapy at [facility] Disposition: Home with husband Planned DVT Prophylaxis: Aspirin 325 mg BID DME needed: Dan Humphreys, 3-n-1 PCP: Dr. Luiz Iron TXA: IV Allergies: Amoxicillin (rash) Anesthesia Concerns: Nausea/vomiting  - Patient was instructed on what medications to stop prior to surgery. - Follow-up visit in 2 weeks with Dr. Lequita Halt - Begin physical therapy following surgery - Pre-operative lab work as pre-surgical testing - Prescriptions will be provided in hospital at time of discharge  Arther Abbott, PA-C Orthopedic Surgery [facility] [place]

## 2018-01-03 ENCOUNTER — Encounter (HOSPITAL_COMMUNITY): Payer: Self-pay

## 2018-01-03 NOTE — Patient Instructions (Signed)
Tracey Kramer  01/03/2018   Your procedure is scheduled on: 01-13-18  Report to [facility] Main  Entrance             Report to admitting at      0550 AM    Call this number if you have problems the morning of surgery [phone]    Remember: Do not eat food or drink liquids :After Midnight. BRUSH YOUR TEETH MORNING OF SURGERY AND RINSE YOUR MOUTH OUT, NO CHEWING GUM CANDY OR MINTS.     Take these medicines the morning of surgery with A SIP OF WATER:paxil, omeprazole, levothyroxine                                You may not have any metal on your body including hair pins and              piercings  Do not wear jewelry, make-up, lotions, powders or perfumes, deodorant             Do not wear nail polish.  Do not shave  48 hours prior to surgery.             Do not bring valuables to the hospital. [place] IS NOT             RESPONSIBLE   FOR VALUABLES.  Contacts, dentures or bridgework may not be worn into surgery.  Leave suitcase in the car. After surgery it may be brought to your room.                   Please read over the following fact sheets you were given: _____________________________________________________________________          [facility] - Preparing for Surgery Before surgery, you can play an important role.  Because skin is not sterile, your skin needs to be as free of germs as possible.  You can reduce the number of germs on your skin by washing with CHG (chlorahexidine gluconate) soap before surgery.  CHG is an antiseptic cleaner which kills germs and bonds with the skin to continue killing germs even after washing. Please DO NOT use if you have an allergy to CHG or antibacterial soaps.  If your skin becomes reddened/irritated stop using the CHG and inform your nurse when you arrive at Short Stay. Do not shave (including legs and underarms) for at least 48 hours prior to the first CHG shower.  You may shave your face/neck. Please  follow these instructions carefully:  1.  Shower with CHG Soap the night before surgery and the  morning of Surgery.  2.  If you choose to wash your hair, wash your hair first as usual with your  normal  shampoo.  3.  After you shampoo, rinse your hair and body thoroughly to remove the  shampoo.                           4.  Use CHG as you would any other liquid soap.  You can apply chg directly  to the skin and wash                       Gently with a scrungie or clean washcloth.  5.  Apply the CHG Soap to  your body ONLY FROM THE NECK DOWN.   Do not use on face/ open                           Wound or open sores. Avoid contact with eyes, ears mouth and genitals (private parts).                       Wash face,  Genitals (private parts) with your normal soap.             6.  Wash thoroughly, paying special attention to the area where your surgery  will be performed.  7.  Thoroughly rinse your body with warm water from the neck down.  8.  DO NOT shower/wash with your normal soap after using and rinsing off  the CHG Soap.                9.  Pat yourself dry with a clean towel.            10.  Wear clean pajamas.            11.  Place clean sheets on your bed the night of your first shower and do not  sleep with pets. Day of Surgery : Do not apply any lotions/deodorants the morning of surgery.  Please wear clean clothes to the hospital/surgery center.  FAILURE TO FOLLOW THESE INSTRUCTIONS MAY RESULT IN THE CANCELLATION OF YOUR SURGERY PATIENT SIGNATURE_________________________________  NURSE SIGNATURE__________________________________  ________________________________________________________________________  WHAT IS A BLOOD TRANSFUSION? Blood Transfusion Information  A transfusion is the replacement of blood or some of its parts. Blood is made up of multiple cells which provide different functions.  Red blood cells carry oxygen and are used for blood loss replacement.  White blood cells  fight against infection.  Platelets control bleeding.  Plasma helps clot blood.  Other blood products are available for specialized needs, such as hemophilia or other clotting disorders. BEFORE THE TRANSFUSION  Who gives blood for transfusions?   Healthy volunteers who are fully evaluated to make sure their blood is safe. This is blood bank blood. Transfusion therapy is the safest it has ever been in the practice of medicine. Before blood is taken from a donor, a complete history is taken to make sure that person has no history of diseases nor engages in risky social behavior (examples are intravenous drug use or sexual activity with multiple partners). The donor's travel history is screened to minimize risk of transmitting infections, such as malaria. The donated blood is tested for signs of infectious diseases, such as HIV and hepatitis. The blood is then tested to be sure it is compatible with you in order to minimize the chance of a transfusion reaction. If you or a relative donates blood, this is often done in anticipation of surgery and is not appropriate for emergency situations. It takes many days to process the donated blood. RISKS AND COMPLICATIONS Although transfusion therapy is very safe and saves many lives, the main dangers of transfusion include:   Getting an infectious disease.  Developing a transfusion reaction. This is an allergic reaction to something in the blood you were given. Every precaution is taken to prevent this. The decision to have a blood transfusion has been considered carefully by your caregiver before blood is given. Blood is not given unless the benefits outweigh the risks. AFTER THE TRANSFUSION  Right after receiving a blood transfusion, you will usually  feel much better and more energetic. This is especially true if your red blood cells have gotten low (anemic). The transfusion raises the level of the red blood cells which carry oxygen, and this usually  causes an energy increase.  The nurse administering the transfusion will monitor you carefully for complications. HOME CARE INSTRUCTIONS  No special instructions are needed after a transfusion. You may find your energy is better. Speak with your caregiver about any limitations on activity for underlying diseases you may have. SEEK MEDICAL CARE IF:   Your condition is not improving after your transfusion.  You develop redness or irritation at the intravenous (IV) site. SEEK IMMEDIATE MEDICAL CARE IF:  Any of the following symptoms occur over the next 12 hours:  Shaking chills.  You have a temperature by mouth above 102 F (38.9 C), not controlled by medicine.  Chest, back, or muscle pain.  People around you feel you are not acting correctly or are confused.  Shortness of breath or difficulty breathing.  Dizziness and fainting.  You get a rash or develop hives.  You have a decrease in urine output.  Your urine turns a dark color or changes to pink, red, or brown. Any of the following symptoms occur over the next 10 days:  You have a temperature by mouth above 102 F (38.9 C), not controlled by medicine.  Shortness of breath.  Weakness after normal activity.  The white part of the eye turns yellow (jaundice).  You have a decrease in the amount of urine or are urinating less often.  Your urine turns a dark color or changes to pink, red, or brown. Document Released: 03/02/2000 Document Revised: 05/28/2011 Document Reviewed: 10/20/2007 ExitCare Patient Information 2014 [place].  _______________________________________________________________________  Incentive Spirometer  An incentive spirometer is a tool that can help keep your lungs clear and active. This tool measures how well you are filling your lungs with each breath. Taking long deep breaths may help reverse or decrease the chance of developing breathing (pulmonary) problems (especially infection)  following:  A long period of time when you are unable to move or be active. BEFORE THE PROCEDURE   If the spirometer includes an indicator to show your best effort, your nurse or respiratory therapist will set it to a desired goal.  If possible, sit up straight or lean slightly forward. Try not to slouch.  Hold the incentive spirometer in an upright position. INSTRUCTIONS FOR USE  1. Sit on the edge of your bed if possible, or sit up as far as you can in bed or on a chair. 2. Hold the incentive spirometer in an upright position. 3. Breathe out normally. 4. Place the mouthpiece in your mouth and seal your lips tightly around it. 5. Breathe in slowly and as deeply as possible, raising the piston or the ball toward the top of the column. 6. Hold your breath for 3-5 seconds or for as long as possible. Allow the piston or ball to fall to the bottom of the column. 7. Remove the mouthpiece from your mouth and breathe out normally. 8. Rest for a few seconds and repeat Steps 1 through 7 at least 10 times every 1-2 hours when you are awake. Take your time and take a few normal breaths between deep breaths. 9. The spirometer may include an indicator to show your best effort. Use the indicator as a goal to work toward during each repetition. 10. After each set of 10 deep breaths, practice coughing to  be sure your lungs are clear. If you have an incision (the cut made at the time of surgery), support your incision when coughing by placing a pillow or rolled up towels firmly against it. Once you are able to get out of bed, walk around indoors and cough well. You may stop using the incentive spirometer when instructed by your caregiver.  RISKS AND COMPLICATIONS  Take your time so you do not get dizzy or light-headed.  If you are in pain, you may need to take or ask for pain medication before doing incentive spirometry. It is harder to take a deep breath if you are having pain. AFTER USE  Rest and  breathe slowly and easily.  It can be helpful to keep track of a log of your progress. Your caregiver can provide you with a simple table to help with this. If you are using the spirometer at home, follow these instructions: [place] IF:   You are having difficultly using the spirometer.  You have trouble using the spirometer as often as instructed.  Your pain medication is not giving enough relief while using the spirometer.  You develop fever of 100.5 F (38.1 C) or higher. SEEK IMMEDIATE MEDICAL CARE IF:   You cough up bloody sputum that had not been present before.  You develop fever of 102 F (38.9 C) or greater.  You develop worsening pain at or near the incision site. MAKE SURE YOU:   Understand these instructions.  Will watch your condition.  Will get help right away if you are not doing well or get worse. Document Released: 07/16/2006 Document Revised: 05/28/2011 Document Reviewed: 09/16/2006 [facility] Patient Information 2014 [place], [place].   ________________________________________________________________________

## 2018-01-06 ENCOUNTER — Other Ambulatory Visit: Payer: Self-pay

## 2018-01-06 ENCOUNTER — Encounter (INDEPENDENT_AMBULATORY_CARE_PROVIDER_SITE_OTHER): Payer: Self-pay

## 2018-01-06 ENCOUNTER — Encounter (HOSPITAL_COMMUNITY)
Admission: RE | Admit: 2018-01-06 | Discharge: 2018-01-06 | Disposition: A | Discharge status: Home / Self Care | Payer: Medicare HMO | Source: Ambulatory Visit | Attending: Orthopedic Surgery | Admitting: Orthopedic Surgery

## 2018-01-06 ENCOUNTER — Encounter (HOSPITAL_COMMUNITY): Payer: Self-pay

## 2018-01-06 DIAGNOSIS — M1712 Unilateral primary osteoarthritis, left knee: Secondary | ICD-10-CM | POA: Insufficient documentation

## 2018-01-06 DIAGNOSIS — Z01818 Encounter for other preprocedural examination: Secondary | ICD-10-CM | POA: Insufficient documentation

## 2018-01-06 HISTORY — DX: Nonrheumatic mitral (valve) insufficiency: I34.0

## 2018-01-06 HISTORY — DX: Major depressive disorder, single episode, unspecified: F32.9

## 2018-01-06 HISTORY — DX: Depression, unspecified: F32.A

## 2018-01-06 HISTORY — DX: Unspecified hearing loss, unspecified ear: H91.90

## 2018-01-06 HISTORY — DX: Nonrheumatic pulmonary valve insufficiency: I37.1

## 2018-01-06 HISTORY — DX: Rheumatic tricuspid insufficiency: I07.1

## 2018-01-06 HISTORY — DX: Nonrheumatic aortic (valve) insufficiency: I35.1

## 2018-01-06 HISTORY — DX: Anxiety disorder, unspecified: F41.9

## 2018-01-06 HISTORY — DX: Irritable bowel syndrome, unspecified: K58.9

## 2018-01-06 HISTORY — DX: Hypothyroidism, unspecified: E03.9

## 2018-01-06 LAB — COMPREHENSIVE METABOLIC PANEL
ALBUMIN: 4.1 g/dL (ref 3.5–5.0)
ALK PHOS: 56 U/L (ref 38–126)
ALT: 16 U/L (ref 0–44)
AST: 18 U/L (ref 15–41)
Anion gap: 7 (ref 5–15)
BILIRUBIN TOTAL: 0.6 mg/dL (ref 0.3–1.2)
BUN: 14 mg/dL (ref 8–23)
CO2: 29 mmol/L (ref 22–32)
CREATININE: 0.66 mg/dL (ref 0.44–1.00)
Calcium: 9.8 mg/dL (ref 8.9–10.3)
Chloride: 105 mmol/L (ref 98–111)
GFR calc Af Amer: >60 mL/min (ref >60)
Glucose, Bld: 101 mg/dL — ABNORMAL HIGH (ref 70–99)
POTASSIUM: 4 mmol/L (ref 3.5–5.1)
Sodium: 141 mmol/L (ref 135–145)
TOTAL PROTEIN: 7.2 g/dL (ref 6.5–8.1)

## 2018-01-06 LAB — CBC
HEMATOCRIT: 39.6 % (ref 36.0–46.0)
HEMOGLOBIN: 13.1 g/dL (ref 12.0–15.0)
MCH: 30 pg (ref 26.0–34.0)
MCHC: 33.1 g/dL (ref 30.0–36.0)
MCV: 90.6 fL (ref 80.0–100.0)
NRBC: 0 % (ref 0.0–0.2)
Platelets: 325 10*3/uL (ref 150–400)
RBC: 4.37 MIL/uL (ref 3.87–5.11)
RDW: 12.4 % (ref 11.5–15.5)
WBC: 5 10*3/uL (ref 4.0–10.5)

## 2018-01-06 LAB — PROTIME-INR
INR: 0.89
PROTHROMBIN TIME: 11.9 s (ref 11.4–15.2)

## 2018-01-06 LAB — ABO/RH: ABO/RH(D): A POS

## 2018-01-06 LAB — SURGICAL PCR SCREEN
MRSA, PCR: NEGATIVE
STAPHYLOCOCCUS AUREUS: NEGATIVE

## 2018-01-06 LAB — APTT: aPTT: 28 seconds (ref 24–36)

## 2018-01-06 NOTE — Pre-Procedure Instructions (Signed)
ECHO 11/11/2017 in hard chart

## 2018-01-06 NOTE — Pre-Procedure Instructions (Signed)
EKG 10/23/2017 in hard chart

## 2018-01-12 MED ORDER — BUPIVACAINE LIPOSOME 1.3 % IJ SUSP
20.0000 mL | Freq: Once | INTRAMUSCULAR | Status: DC
  Filled 2018-01-12: qty 20

## 2018-01-13 ENCOUNTER — Other Ambulatory Visit: Payer: Self-pay

## 2018-01-13 ENCOUNTER — Encounter (HOSPITAL_COMMUNITY)
Admission: RE | Disposition: A | Discharge status: Home / Self Care | Payer: Self-pay | Source: Home / Self Care | Attending: Orthopedic Surgery

## 2018-01-13 ENCOUNTER — Inpatient Hospital Stay (HOSPITAL_COMMUNITY): Payer: Medicare HMO | Admitting: Anesthesiology

## 2018-01-13 ENCOUNTER — Encounter (HOSPITAL_COMMUNITY): Payer: Self-pay | Admitting: *Deleted

## 2018-01-13 ENCOUNTER — Inpatient Hospital Stay (HOSPITAL_COMMUNITY)
Admission: RE | Admit: 2018-01-13 | Discharge: 2018-01-14 | DRG: 470 | Disposition: A | Discharge status: Home / Self Care | Payer: Medicare HMO | Attending: Orthopedic Surgery | Admitting: Orthopedic Surgery

## 2018-01-13 DIAGNOSIS — M1712 Unilateral primary osteoarthritis, left knee: Secondary | ICD-10-CM | POA: Diagnosis present

## 2018-01-13 DIAGNOSIS — E039 Hypothyroidism, unspecified: Secondary | ICD-10-CM | POA: Diagnosis present

## 2018-01-13 DIAGNOSIS — M179 Osteoarthritis of knee, unspecified: Secondary | ICD-10-CM | POA: Diagnosis present

## 2018-01-13 DIAGNOSIS — H9190 Unspecified hearing loss, unspecified ear: Secondary | ICD-10-CM | POA: Diagnosis present

## 2018-01-13 DIAGNOSIS — Z79899 Other long term (current) drug therapy: Secondary | ICD-10-CM | POA: Diagnosis not present

## 2018-01-13 DIAGNOSIS — I10 Essential (primary) hypertension: Secondary | ICD-10-CM | POA: Diagnosis present

## 2018-01-13 DIAGNOSIS — M25562 Pain in left knee: Secondary | ICD-10-CM | POA: Diagnosis present

## 2018-01-13 DIAGNOSIS — Z7982 Long term (current) use of aspirin: Secondary | ICD-10-CM | POA: Diagnosis not present

## 2018-01-13 DIAGNOSIS — E785 Hyperlipidemia, unspecified: Secondary | ICD-10-CM | POA: Diagnosis present

## 2018-01-13 DIAGNOSIS — Z9049 Acquired absence of other specified parts of digestive tract: Secondary | ICD-10-CM

## 2018-01-13 DIAGNOSIS — Z79890 Hormone replacement therapy (postmenopausal): Secondary | ICD-10-CM | POA: Diagnosis not present

## 2018-01-13 DIAGNOSIS — M1710 Unilateral primary osteoarthritis, unspecified knee: Secondary | ICD-10-CM

## 2018-01-13 HISTORY — PX: TOTAL KNEE ARTHROPLASTY: SHX125

## 2018-01-13 LAB — TYPE AND SCREEN
ABO/RH(D): A POS
ANTIBODY SCREEN: NEGATIVE

## 2018-01-13 SURGERY — ARTHROPLASTY, KNEE, TOTAL
Anesthesia: Spinal | Site: Knee | Laterality: Left

## 2018-01-13 MED ORDER — PHENYLEPHRINE 40 MCG/ML (10ML) SYRINGE FOR IV PUSH (FOR BLOOD PRESSURE SUPPORT)
PREFILLED_SYRINGE | INTRAVENOUS | Status: DC | PRN
  Administered 2018-01-13: 80 ug via INTRAVENOUS

## 2018-01-13 MED ORDER — MIDAZOLAM HCL 2 MG/2ML IJ SOLN
INTRAMUSCULAR | Status: AC
  Administered 2018-01-13: 1 mg via INTRAVENOUS
  Filled 2018-01-13: qty 2

## 2018-01-13 MED ORDER — OXYCODONE HCL 5 MG PO TABS
5.0000 mg | ORAL_TABLET | ORAL | Status: DC | PRN
  Administered 2018-01-13 – 2018-01-14 (×4): 10 mg via ORAL
  Filled 2018-01-13 (×5): qty 2

## 2018-01-13 MED ORDER — SODIUM CHLORIDE 0.9 % IR SOLN
Status: DC | PRN
  Administered 2018-01-13: 1000 mL

## 2018-01-13 MED ORDER — HYDROMORPHONE HCL 1 MG/ML IJ SOLN: 0.2500 mg | INTRAMUSCULAR | Status: DC | PRN

## 2018-01-13 MED ORDER — OXYCODONE HCL 5 MG PO TABS: 5.0000 mg | ORAL_TABLET | ORAL | Status: DC | PRN

## 2018-01-13 MED ORDER — DEXAMETHASONE SODIUM PHOSPHATE 10 MG/ML IJ SOLN
INTRAMUSCULAR | Status: AC
  Filled 2018-01-13: qty 1

## 2018-01-13 MED ORDER — ASPIRIN EC 325 MG PO TBEC: 325.0000 mg | DELAYED_RELEASE_TABLET | Freq: Two times a day (BID) | ORAL | Status: DC

## 2018-01-13 MED ORDER — GABAPENTIN 300 MG PO CAPS
300.0000 mg | ORAL_CAPSULE | Freq: Three times a day (TID) | ORAL | Status: DC
  Administered 2018-01-13 – 2018-01-14 (×4): 300 mg via ORAL
  Filled 2018-01-13 (×4): qty 1

## 2018-01-13 MED ORDER — BUPIVACAINE HCL (PF) 0.25 % IJ SOLN
INTRAMUSCULAR | Status: AC
  Filled 2018-01-13: qty 30

## 2018-01-13 MED ORDER — CHLORHEXIDINE GLUCONATE 4 % EX LIQD: 60.0000 mL | Freq: Once | CUTANEOUS | Status: DC

## 2018-01-13 MED ORDER — SODIUM CHLORIDE 0.9 % IJ SOLN
INTRAMUSCULAR | Status: AC
  Filled 2018-01-13: qty 50

## 2018-01-13 MED ORDER — CEFAZOLIN SODIUM-DEXTROSE 1-4 GM/50ML-% IV SOLN
1.0000 g | Freq: Four times a day (QID) | INTRAVENOUS | Status: AC
  Administered 2018-01-13 (×2): 1 g via INTRAVENOUS
  Filled 2018-01-13 (×2): qty 50

## 2018-01-13 MED ORDER — MIDAZOLAM HCL 2 MG/2ML IJ SOLN
1.0000 mg | INTRAMUSCULAR | Status: DC
  Administered 2018-01-13: 1 mg via INTRAVENOUS

## 2018-01-13 MED ORDER — DEXAMETHASONE SODIUM PHOSPHATE 10 MG/ML IJ SOLN: 8.0000 mg | Freq: Once | INTRAMUSCULAR | Status: DC

## 2018-01-13 MED ORDER — BUPIVACAINE LIPOSOME 1.3 % IJ SUSP
INTRAMUSCULAR | Status: DC | PRN
  Administered 2018-01-13: 20 mL

## 2018-01-13 MED ORDER — PAROXETINE HCL 10 MG PO TABS
10.0000 mg | ORAL_TABLET | Freq: Every day | ORAL | Status: DC
  Administered 2018-01-14: 10 mg via ORAL
  Filled 2018-01-13: qty 1

## 2018-01-13 MED ORDER — ONDANSETRON HCL 4 MG/2ML IJ SOLN
INTRAMUSCULAR | Status: DC | PRN
  Administered 2018-01-13: 4 mg via INTRAVENOUS

## 2018-01-13 MED ORDER — SODIUM CHLORIDE 0.9 % IJ SOLN
INTRAMUSCULAR | Status: AC
  Filled 2018-01-13: qty 10

## 2018-01-13 MED ORDER — DEXAMETHASONE SODIUM PHOSPHATE 10 MG/ML IJ SOLN
INTRAMUSCULAR | Status: DC | PRN
  Administered 2018-01-13: 10 mg via INTRAVENOUS

## 2018-01-13 MED ORDER — METOCLOPRAMIDE HCL 5 MG PO TABS: 5.0000 mg | ORAL_TABLET | Freq: Three times a day (TID) | ORAL | Status: DC | PRN

## 2018-01-13 MED ORDER — ACETAMINOPHEN 10 MG/ML IV SOLN
1000.0000 mg | Freq: Four times a day (QID) | INTRAVENOUS | Status: DC
  Administered 2018-01-13: 1000 mg via INTRAVENOUS
  Filled 2018-01-13: qty 100

## 2018-01-13 MED ORDER — TRAMADOL HCL 50 MG PO TABS: 50.0000 mg | ORAL_TABLET | Freq: Four times a day (QID) | ORAL | Status: DC | PRN

## 2018-01-13 MED ORDER — TRIAMTERENE-HCTZ 37.5-25 MG PO CAPS
1.0000 | ORAL_CAPSULE | Freq: Every day | ORAL | Status: DC
  Filled 2018-01-13: qty 1

## 2018-01-13 MED ORDER — SODIUM CHLORIDE 0.9 % IV SOLN
INTRAVENOUS | Status: DC
  Administered 2018-01-13 – 2018-01-14 (×2): via INTRAVENOUS

## 2018-01-13 MED ORDER — BISACODYL 10 MG RE SUPP: 10.0000 mg | Freq: Every day | RECTAL | Status: DC | PRN

## 2018-01-13 MED ORDER — FENTANYL CITRATE (PF) 100 MCG/2ML IJ SOLN
INTRAMUSCULAR | Status: AC
  Administered 2018-01-13: 50 ug via INTRAVENOUS
  Filled 2018-01-13: qty 2

## 2018-01-13 MED ORDER — LEVOTHYROXINE SODIUM 88 MCG PO TABS
88.0000 ug | ORAL_TABLET | Freq: Every day | ORAL | Status: DC
  Administered 2018-01-13: 88 ug via ORAL
  Filled 2018-01-13: qty 1

## 2018-01-13 MED ORDER — MEPERIDINE HCL 50 MG/ML IJ SOLN: 6.2500 mg | INTRAMUSCULAR | Status: DC | PRN

## 2018-01-13 MED ORDER — TRAZODONE HCL 50 MG PO TABS
50.0000 mg | ORAL_TABLET | Freq: Every day | ORAL | Status: DC
  Administered 2018-01-13: 50 mg via ORAL
  Filled 2018-01-13: qty 1

## 2018-01-13 MED ORDER — CEFAZOLIN SODIUM-DEXTROSE 2-4 GM/100ML-% IV SOLN
2.0000 g | INTRAVENOUS | Status: AC
  Administered 2018-01-13: 2 g via INTRAVENOUS
  Filled 2018-01-13: qty 100

## 2018-01-13 MED ORDER — METOCLOPRAMIDE HCL 5 MG/ML IJ SOLN: 5.0000 mg | Freq: Three times a day (TID) | INTRAMUSCULAR | Status: DC | PRN

## 2018-01-13 MED ORDER — METHOCARBAMOL 500 MG IVPB - SIMPLE MED
500.0000 mg | Freq: Four times a day (QID) | INTRAVENOUS | Status: DC | PRN
  Filled 2018-01-13: qty 50

## 2018-01-13 MED ORDER — GABAPENTIN 300 MG PO CAPS
300.0000 mg | ORAL_CAPSULE | Freq: Once | ORAL | Status: AC
  Administered 2018-01-13: 300 mg via ORAL
  Filled 2018-01-13: qty 1

## 2018-01-13 MED ORDER — SODIUM CHLORIDE 0.9 % IJ SOLN
INTRAMUSCULAR | Status: DC | PRN
  Administered 2018-01-13: 60 mL

## 2018-01-13 MED ORDER — PHENOL 1.4 % MT LIQD
1.0000 | OROMUCOSAL | Status: DC | PRN
  Filled 2018-01-13: qty 177

## 2018-01-13 MED ORDER — MENTHOL 3 MG MT LOZG: 1.0000 | LOZENGE | OROMUCOSAL | Status: DC | PRN

## 2018-01-13 MED ORDER — TRANEXAMIC ACID-NACL 1000-0.7 MG/100ML-% IV SOLN
1000.0000 mg | INTRAVENOUS | Status: AC
  Administered 2018-01-13: 1000 mg via INTRAVENOUS
  Filled 2018-01-13: qty 100

## 2018-01-13 MED ORDER — METHOCARBAMOL 500 MG PO TABS
500.0000 mg | ORAL_TABLET | Freq: Four times a day (QID) | ORAL | Status: DC | PRN
  Administered 2018-01-14: 500 mg via ORAL
  Filled 2018-01-13: qty 1

## 2018-01-13 MED ORDER — BUPIVACAINE IN DEXTROSE 0.75-8.25 % IT SOLN
INTRATHECAL | Status: DC | PRN
  Administered 2018-01-13: 1.7 mL via INTRATHECAL

## 2018-01-13 MED ORDER — BUPIVACAINE HCL (PF) 0.5 % IJ SOLN
INTRAMUSCULAR | Status: DC | PRN
  Administered 2018-01-13: 25 mL

## 2018-01-13 MED ORDER — MORPHINE SULFATE (PF) 4 MG/ML IV SOLN: 1.0000 mg | INTRAVENOUS | Status: DC | PRN

## 2018-01-13 MED ORDER — ONDANSETRON HCL 4 MG/2ML IJ SOLN: 4.0000 mg | Freq: Four times a day (QID) | INTRAMUSCULAR | Status: DC | PRN

## 2018-01-13 MED ORDER — ONDANSETRON HCL 4 MG PO TABS: 4.0000 mg | ORAL_TABLET | Freq: Four times a day (QID) | ORAL | Status: DC | PRN

## 2018-01-13 MED ORDER — ONDANSETRON HCL 4 MG/2ML IJ SOLN
INTRAMUSCULAR | Status: AC
  Filled 2018-01-13: qty 2

## 2018-01-13 MED ORDER — FLEET ENEMA 7-19 GM/118ML RE ENEM: 1.0000 | ENEMA | Freq: Once | RECTAL | Status: DC | PRN

## 2018-01-13 MED ORDER — PANTOPRAZOLE SODIUM 40 MG PO TBEC
40.0000 mg | DELAYED_RELEASE_TABLET | Freq: Every day | ORAL | Status: DC
  Administered 2018-01-14: 40 mg via ORAL
  Filled 2018-01-13: qty 1

## 2018-01-13 MED ORDER — EPHEDRINE 5 MG/ML INJ
INTRAVENOUS | Status: AC
  Filled 2018-01-13: qty 10

## 2018-01-13 MED ORDER — LACTATED RINGERS IV SOLN
INTRAVENOUS | Status: DC
  Administered 2018-01-13 (×2): via INTRAVENOUS

## 2018-01-13 MED ORDER — PROPOFOL 500 MG/50ML IV EMUL
INTRAVENOUS | Status: DC | PRN
  Administered 2018-01-13: 100 ug/kg/min via INTRAVENOUS

## 2018-01-13 MED ORDER — TRANEXAMIC ACID-NACL 1000-0.7 MG/100ML-% IV SOLN
1000.0000 mg | Freq: Once | INTRAVENOUS | Status: AC
  Administered 2018-01-13: 1000 mg via INTRAVENOUS
  Filled 2018-01-13: qty 100

## 2018-01-13 MED ORDER — SIMVASTATIN 40 MG PO TABS
40.0000 mg | ORAL_TABLET | Freq: Every evening | ORAL | Status: DC
  Administered 2018-01-13: 40 mg via ORAL
  Filled 2018-01-13 (×2): qty 1
  Filled 2018-01-13: qty 2

## 2018-01-13 MED ORDER — CLONIDINE HCL (ANALGESIA) 100 MCG/ML EP SOLN
EPIDURAL | Status: DC | PRN
  Administered 2018-01-13: 50 ug

## 2018-01-13 MED ORDER — DIPHENHYDRAMINE HCL 12.5 MG/5ML PO ELIX: 12.5000 mg | ORAL_SOLUTION | ORAL | Status: DC | PRN

## 2018-01-13 MED ORDER — DEXAMETHASONE SODIUM PHOSPHATE 10 MG/ML IJ SOLN
10.0000 mg | Freq: Once | INTRAMUSCULAR | Status: AC
  Administered 2018-01-14: 10 mg via INTRAVENOUS
  Filled 2018-01-13: qty 1

## 2018-01-13 MED ORDER — DOCUSATE SODIUM 100 MG PO CAPS
100.0000 mg | ORAL_CAPSULE | Freq: Two times a day (BID) | ORAL | Status: DC
  Administered 2018-01-13 – 2018-01-14 (×2): 100 mg via ORAL
  Filled 2018-01-13 (×2): qty 1

## 2018-01-13 MED ORDER — POLYETHYLENE GLYCOL 3350 17 G PO PACK: 17.0000 g | PACK | Freq: Every day | ORAL | Status: DC | PRN

## 2018-01-13 MED ORDER — FENTANYL CITRATE (PF) 100 MCG/2ML IJ SOLN
50.0000 ug | INTRAMUSCULAR | Status: DC
  Administered 2018-01-13: 50 ug via INTRAVENOUS

## 2018-01-13 MED ORDER — PROMETHAZINE HCL 25 MG/ML IJ SOLN: 6.2500 mg | INTRAMUSCULAR | Status: DC | PRN

## 2018-01-13 MED ORDER — PROPOFOL 10 MG/ML IV BOLUS
INTRAVENOUS | Status: AC
  Filled 2018-01-13: qty 40

## 2018-01-13 MED ORDER — EPHEDRINE SULFATE-NACL 50-0.9 MG/10ML-% IV SOSY
PREFILLED_SYRINGE | INTRAVENOUS | Status: DC | PRN
  Administered 2018-01-13: 10 mg via INTRAVENOUS
  Administered 2018-01-13: 15 mg via INTRAVENOUS
  Administered 2018-01-13: 10 mg via INTRAVENOUS

## 2018-01-13 MED ORDER — ACETAMINOPHEN 500 MG PO TABS
1000.0000 mg | ORAL_TABLET | Freq: Four times a day (QID) | ORAL | Status: AC
  Administered 2018-01-13 – 2018-01-14 (×3): 1000 mg via ORAL
  Filled 2018-01-13 (×3): qty 2

## 2018-01-13 MED ORDER — DICYCLOMINE HCL 20 MG PO TABS
20.0000 mg | ORAL_TABLET | Freq: Three times a day (TID) | ORAL | Status: DC | PRN
  Filled 2018-01-13: qty 1

## 2018-01-13 SURGICAL SUPPLY — 59 items
ATTUNE PSFEM LTSZ4 NARCEM KNEE (Femur) ×3 IMPLANT
ATTUNE PSRP INSR SZ4 10 KNEE (Insert) ×2 IMPLANT
ATTUNE PSRP INSR SZ4 10MM KNEE (Insert) ×1 IMPLANT
BAG ZIPLOCK 12X15 (MISCELLANEOUS) ×3 IMPLANT
BANDAGE ACE 6X5 VEL STRL LF (GAUZE/BANDAGES/DRESSINGS) ×3 IMPLANT
BASE TIBIAL ROT PLAT SZ 3 KNEE (Knees) ×1 IMPLANT
BLADE SAG 18X100X1.27 (BLADE) ×3 IMPLANT
BLADE SAW SGTL 11.0X1.19X90.0M (BLADE) ×3 IMPLANT
BOWL SMART MIX CTS (DISPOSABLE) ×3 IMPLANT
CEMENT HV SMART SET (Cement) ×6 IMPLANT
CLOSURE STERI-STRIP 1/2X4 (GAUZE/BANDAGES/DRESSINGS) ×1
CLOSURE WOUND 1/2 X4 (GAUZE/BANDAGES/DRESSINGS) ×2
CLSR STERI-STRIP ANTIMIC 1/2X4 (GAUZE/BANDAGES/DRESSINGS) ×2 IMPLANT
COVER SURGICAL LIGHT HANDLE (MISCELLANEOUS) ×3 IMPLANT
COVER WAND RF STERILE (DRAPES) ×3 IMPLANT
CUFF TOURN SGL QUICK 34 (TOURNIQUET CUFF) ×2
CUFF TRNQT CYL 34X4X40X1 (TOURNIQUET CUFF) ×1 IMPLANT
DECANTER SPIKE VIAL GLASS SM (MISCELLANEOUS) ×3 IMPLANT
DRAPE U-SHAPE 47X51 STRL (DRAPES) ×3 IMPLANT
DRSG ADAPTIC 3X8 NADH LF (GAUZE/BANDAGES/DRESSINGS) ×3 IMPLANT
DRSG EMULSION OIL 3X16 NADH (GAUZE/BANDAGES/DRESSINGS) ×3 IMPLANT
DRSG PAD ABDOMINAL 8X10 ST (GAUZE/BANDAGES/DRESSINGS) ×3 IMPLANT
DURAPREP 26ML APPLICATOR (WOUND CARE) ×3 IMPLANT
ELECT REM PT RETURN 15FT ADLT (MISCELLANEOUS) ×3 IMPLANT
EVACUATOR 1/8 PVC DRAIN (DRAIN) ×3 IMPLANT
GAUZE SPONGE 4X4 12PLY STRL (GAUZE/BANDAGES/DRESSINGS) ×3 IMPLANT
GLOVE BIO SURGEON STRL SZ8 (GLOVE) ×3 IMPLANT
GLOVE BIOGEL PI IND STRL 6.5 (GLOVE) ×1 IMPLANT
GLOVE BIOGEL PI IND STRL 8 (GLOVE) ×1 IMPLANT
GLOVE BIOGEL PI INDICATOR 6.5 (GLOVE) ×2
GLOVE BIOGEL PI INDICATOR 8 (GLOVE) ×2
GLOVE SURG SS PI 6.5 STRL IVOR (GLOVE) ×3 IMPLANT
GOWN STRL REUS W/TWL LRG LVL3 (GOWN DISPOSABLE) ×9 IMPLANT
GOWN STRL REUS W/TWL XL LVL3 (GOWN DISPOSABLE) IMPLANT
HANDPIECE INTERPULSE COAX TIP (DISPOSABLE) ×2
HOLDER FOLEY CATH W/STRAP (MISCELLANEOUS) IMPLANT
IMMOBILIZER KNEE 20 (SOFTGOODS) ×3
IMMOBILIZER KNEE 20 THIGH 36 (SOFTGOODS) ×1 IMPLANT
MANIFOLD NEPTUNE II (INSTRUMENTS) ×3 IMPLANT
NS IRRIG 1000ML POUR BTL (IV SOLUTION) IMPLANT
PACK TOTAL KNEE CUSTOM (KITS) ×3 IMPLANT
PAD ABD 8X10 STRL (GAUZE/BANDAGES/DRESSINGS) ×3 IMPLANT
PADDING CAST COTTON 6X4 STRL (CAST SUPPLIES) ×9 IMPLANT
PATELLA MEDIAL ATTUN 35MM KNEE (Knees) ×3 IMPLANT
PIN STEINMAN FIXATION KNEE (PIN) ×3 IMPLANT
PIN THREADED HEADED SIGMA (PIN) ×3 IMPLANT
POSITIONER SURGICAL ARM (MISCELLANEOUS) ×3 IMPLANT
SET HNDPC FAN SPRY TIP SCT (DISPOSABLE) ×1 IMPLANT
STRIP CLOSURE SKIN 1/2X4 (GAUZE/BANDAGES/DRESSINGS) ×4 IMPLANT
SUT MNCRL AB 4-0 PS2 18 (SUTURE) ×3 IMPLANT
SUT STRATAFIX 0 PDS 27 VIOLET (SUTURE) ×3
SUT VIC AB 2-0 CT1 27 (SUTURE) ×6
SUT VIC AB 2-0 CT1 TAPERPNT 27 (SUTURE) ×3 IMPLANT
SUTURE STRATFX 0 PDS 27 VIOLET (SUTURE) ×1 IMPLANT
TIBIAL BASE ROT PLAT SZ 3 KNEE (Knees) ×3 IMPLANT
TRAY FOLEY MTR SLVR 16FR STAT (SET/KITS/TRAYS/PACK) ×3 IMPLANT
WATER STERILE IRR 1000ML POUR (IV SOLUTION) ×6 IMPLANT
WRAP KNEE MAXI GEL POST OP (GAUZE/BANDAGES/DRESSINGS) ×3 IMPLANT
YANKAUER SUCT BULB TIP 10FT TU (MISCELLANEOUS) ×3 IMPLANT

## 2018-01-13 NOTE — Transfer of Care (Signed)
Immediate Anesthesia Transfer of Care Note  Patient: Tracey Kramer  Procedure(s) Performed: LEFT TOTAL KNEE ARTHROPLASTY (Left Knee)  Patient Location: PACU  Anesthesia Type:Regional and Spinal  Level of Consciousness: sedated  Airway & Oxygen Therapy: Patient Spontanous Breathing and Patient connected to face mask oxygen  Post-op Assessment: Report given to RN and Post -op Vital signs reviewed and stable  Post vital signs: Reviewed and stable  Last Vitals:  Vitals Value Taken Time  BP    Temp    Pulse    Resp    SpO2      Last Pain:  Vitals:   01/13/18 0557  TempSrc: Oral         Complications: No apparent anesthesia complications

## 2018-01-13 NOTE — Progress Notes (Signed)
AssistedDr. Germeroth with left, ultrasound guided, adductor canal block. Side rails up, monitors on throughout procedure. See vital signs in flow sheet. Tolerated Procedure well.  

## 2018-01-13 NOTE — Evaluation (Signed)
Physical Therapy Evaluation Patient Details Name: Tracey Kramer MRN: [id number] DOB: 1945-11-08 Today's Date: 01/13/2018   History of Present Illness  Pt is a 72 year old female s/p L TKA  Clinical Impression  Pt is s/p TKA resulting in the deficits listed below (see PT Problem List).  Pt will benefit from skilled PT to increase their independence and safety with mobility to allow discharge to the venue listed below.  Pt assisted with ambulating however had L LE buckling so limited distance for safety.  Pt very motivated to participate.  Pt plans to d/c back to her retirement community with her spouse.      Follow Up Recommendations Follow surgeon's recommendation for DC plan and follow-up therapies    Equipment Recommendations  None recommended by PT    Recommendations for Other Services       Precautions / Restrictions Precautions Precautions: Fall;Knee Required Braces or Orthoses: Knee Immobilizer - Left Restrictions Other Position/Activity Restrictions: WBAT      Mobility  Bed Mobility Overal bed mobility: Needs Assistance Bed Mobility: Supine to Sit     Supine to sit: Min assist     General bed mobility comments: verbal cues for technique, assist for LE  Transfers Overall transfer level: Needs assistance Equipment used: Rolling walker (2 wheeled) Transfers: Sit to/from Stand Sit to Stand: Min assist         General transfer comment: verbal cues for UE and LE positioning, assist to rise and control descent  Ambulation/Gait Ambulation/Gait assistance: Min assist;Mod assist;+2 safety/equipment Gait Distance (Feet): 25 Feet Assistive device: Rolling walker (2 wheeled) Gait Pattern/deviations: Step-to pattern;Decreased stance time - left;Antalgic     General Gait Details: verbal cues for sequence, RW positioning, step length, pt with buckling L LE (KI in place) however improved with distance, did limit distance for pt safety and recliner was  following  Information systems manager [name] (Stroke Patients Only)       Balance                                             Pertinent Vitals/Pain Pain Assessment: 0-10 Pain Score: 3  Pain Location: L knee Pain Descriptors / Indicators: Aching;Sore Pain Intervention(s): Limited activity within patient's tolerance;Monitored during session;Repositioned;Ice applied    Home Living Family/patient expects to be discharged to:: Private residence Living Arrangements: Spouse/significant other   Type of Home: (retirement community) Home Access: Level entry     Home Layout: One level Home Equipment: Environmental consultant - 2 wheels      Prior Function Level of Independence: Independent               Hand Dominance        Extremity/Trunk Assessment        Lower Extremity Assessment Lower Extremity Assessment: LLE deficits/detail LLE Deficits / Details: able to perform SLR, ankle pumps, ROM TBA       Communication   Communication: No difficulties  Cognition Arousal/Alertness: Awake/alert Behavior During Therapy: WFL for tasks assessed/performed Overall Cognitive Status: Within Functional Limits for tasks assessed                                        General  Comments      Exercises     Assessment/Plan    PT Assessment Patient needs continued PT services  PT Problem List Decreased range of motion;Decreased strength;Decreased mobility;Pain;Decreased knowledge of use of DME;Decreased activity tolerance;Decreased knowledge of precautions       PT Treatment Interventions Stair training;Gait training;Therapeutic exercise;Patient/family education;DME instruction;Therapeutic activities;Functional mobility training    PT Goals (Current goals can be found in the Care Plan section)  Acute Rehab PT Goals PT Goal Formulation: With patient Time For Goal Achievement: 01/17/18 Potential to Achieve Goals:  Good    Frequency 7X/week   Barriers to discharge        Co-evaluation               AM-PAC PT "6 Clicks" Daily Activity  Outcome Measure Difficulty turning over in bed (including adjusting bedclothes, sheets and blankets)?: A Little Difficulty moving from lying on back to sitting on the side of the bed? : Unable Difficulty sitting down on and standing up from a chair with arms (e.g., wheelchair, bedside commode, etc,.)?: Unable Help needed moving to and from a bed to chair (including a wheelchair)?: A Little Help needed walking in hospital room?: A Lot Help needed climbing 3-5 steps with a railing? : A Lot 6 Click Score: 12    End of Session Equipment Utilized During Treatment: Gait belt;Left knee immobilizer Activity Tolerance: Patient tolerated treatment well Patient left: in chair;with chair alarm set;with call bell/phone within reach;with family/visitor present Nurse Communication: Mobility status PT Visit Diagnosis: Other abnormalities of gait and mobility (R26.89)    Time: 1610-9604 PT Time Calculation (min) (ACUTE ONLY): 17 min   Charges:   PT Evaluation $PT Eval Low Complexity: 1 Low        [name], PT, DPT Acute Rehabilitation Services Office: [phone] Pager: [phone]  [name] 01/13/2018, 3:48 PM

## 2018-01-13 NOTE — Anesthesia Postprocedure Evaluation (Signed)
Anesthesia Post Note  Patient: Tracey Kramer  Procedure(s) Performed: LEFT TOTAL KNEE ARTHROPLASTY (Left Knee)     Patient location during evaluation: PACU Anesthesia Type: Spinal Level of consciousness: awake and alert Pain management: pain level controlled Vital Signs Assessment: post-procedure vital signs reviewed and stable Respiratory status: spontaneous breathing and respiratory function stable Cardiovascular status: blood pressure returned to baseline and stable Postop Assessment: spinal receding Anesthetic complications: no    Last Vitals:  Vitals:   01/13/18 1042 01/13/18 1050  BP: (!) 109/43 (!) 106/48  Pulse:  67  Resp:  14  Temp: (!) 36.3 C 36.4 C  SpO2:  100%    Last Pain:  Vitals:   01/13/18 1050  TempSrc: Oral  PainSc:                  [name]

## 2018-01-13 NOTE — Anesthesia Procedure Notes (Signed)
Spinal  Patient location during procedure: OR Start time: 01/13/2018 8:18 AM End time: 01/13/2018 8:20 AM Staffing Anesthesiologist: Nolon Nations, MD Resident/CRNA: Lind Covert, CRNA Performed: resident/CRNA  Preanesthetic Checklist Completed: patient identified, site marked, surgical consent, pre-op evaluation, timeout performed, IV checked, risks and benefits discussed and monitors and equipment checked Spinal Block Patient position: sitting Prep: DuraPrep Patient monitoring: heart rate, cardiac monitor, continuous pulse ox and blood pressure Approach: midline Location: L3-4 Injection technique: single-shot Needle Needle type: Pencan  Needle gauge: 24 G Needle length: 10 cm Needle insertion depth: 6 cm Assessment Sensory level: T6 Additional Notes Timeout performed. SAB kit date checked. SAB without difficulty

## 2018-01-13 NOTE — Op Note (Signed)
OPERATIVE REPORT-TOTAL KNEE ARTHROPLASTY   Pre-operative diagnosis- Osteoarthritis  Left knee(s)  Post-operative diagnosis- Osteoarthritis Left knee(s)  Procedure-  Left  Total Knee Arthroplasty (Depuy Attune)  Surgeon- Gus [name]. Abdallah Hern, MD  Assistant- Dimitri Ped, PA-C   Anesthesia-  Adductor canal block and spinal  EBL-20 mL   Drains Hemovac  Tourniquet time-  Total Tourniquet Time Documented: Thigh (Left) - 33 minutes Total: Thigh (Left) - 33 minutes     Complications- None  Condition-PACU - hemodynamically stable.   Brief Clinical Note  Tracey Kramer is a 72 y.o. year old female with end stage OA of her left knee with progressively worsening pain and dysfunction. She has constant pain, with activity and at rest and significant functional deficits with difficulties even with ADLs. She has had extensive non-op management including analgesics, injections of cortisone and viscosupplements, and home exercise program, but remains in significant pain with significant dysfunction. Radiographs show bone on bone arthritis lateral. She presents now for left Total Knee Arthroplasty.    Procedure in detail---   The patient is brought into the operating room and positioned supine on the operating table. After successful administration of  Adductor canal block and spinal,   a tourniquet is placed high on the  Left thigh(s) and the lower extremity is prepped and draped in the usual sterile fashion. Time out is performed by the operating team and then the  Left lower extremity is wrapped in Esmarch, knee flexed and the tourniquet inflated to 300 mmHg.       A midline incision is made with a ten blade through the subcutaneous tissue to the level of the extensor mechanism. A fresh blade is used to make a medial parapatellar arthrotomy. Soft tissue over the proximal medial tibia is subperiosteally elevated to the joint line with a knife and into the semimembranosus bursa with a Cobb  elevator. Soft tissue over the proximal lateral tibia is elevated with attention being paid to avoiding the patellar tendon on the tibial tubercle. The patella is everted, knee flexed 90 degrees and the ACL and PCL are removed. Findings are bone on bone lateral and patellofemoral        The drill is used to create a starting hole in the distal femur and the canal is thoroughly irrigated with sterile saline to remove the fatty contents. The 5 degree Left  valgus alignment guide is placed into the femoral canal and the distal femoral cutting block is pinned to remove 9 mm off the distal femur. Resection is made with an oscillating saw.      The tibia is subluxed forward and the menisci are removed. The extramedullary alignment guide is placed referencing proximally at the medial aspect of the tibial tubercle and distally along the second metatarsal axis and tibial crest. The block is pinned to remove 2mm off the more deficient lateral  side. Resection is made with an oscillating saw. Size 3is the most appropriate size for the tibia and the proximal tibia is prepared with the modular drill and keel punch for that size.      The femoral sizing guide is placed and size 4 is most appropriate. Rotation is marked off the epicondylar axis and confirmed by creating a rectangular flexion gap at 90 degrees. The size 4 cutting block is pinned in this rotation and the anterior, posterior and chamfer cuts are made with the oscillating saw. The intercondylar block is then placed and that cut is made.  Trial size 3 tibial component, trial size 4 narrow posterior stabilized femur and a 10  mm posterior stabilized rotating platform insert trial is placed. Full extension is achieved with excellent varus/valgus and anterior/posterior balance throughout full range of motion. The patella is everted and thickness measured to be 22  mm. Free hand resection is taken to 12 mm, a 35 template is placed, lug holes are drilled, trial  patella is placed, and it tracks normally. Osteophytes are removed off the posterior femur with the trial in place. All trials are removed and the cut bone surfaces prepared with pulsatile lavage. Cement is mixed and once ready for implantation, the size 3 tibial implant, size  4 narrow posterior stabilized femoral component, and the size 35 patella are cemented in place and the patella is held with the clamp. The trial insert is placed and the knee held in full extension. The Exparel (20 ml mixed with 60 ml saline) is injected into the extensor mechanism, posterior capsule, medial and lateral gutters and subcutaneous tissues.  All extruded cement is removed and once the cement is hard the permanent 10 mm posterior stabilized rotating platform insert is placed into the tibial tray.      The wound is copiously irrigated with saline solution and the extensor mechanism closed over a hemovac drain with #1 V-loc suture. The tourniquet is released for a total tourniquet time of 33  minutes. Flexion against gravity is 140 degrees and the patella tracks normally. Subcutaneous tissue is closed with 2.0 vicryl and subcuticular with running 4.0 Monocryl. The incision is cleaned and dried and steri-strips and a bulky sterile dressing are applied. The limb is placed into a knee immobilizer and the patient is awakened and transported to recovery in stable condition.      Please note that a surgical assistant was a medical necessity for this procedure in order to perform it in a safe and expeditious manner. Surgical assistant was necessary to retract the ligaments and vital neurovascular structures to prevent injury to them and also necessary for proper positioning of the limb to allow for anatomic placement of the prosthesis.   Gus [name] Eliav Mechling, MD    01/13/2018, 9:23 AM

## 2018-01-13 NOTE — Anesthesia Preprocedure Evaluation (Signed)
Anesthesia Evaluation  Patient identified by MRN, date of birth, ID band Patient awake    Reviewed: Allergy & Precautions, H&P , NPO status , Patient's Chart, lab work & pertinent test results  History of Anesthesia Complications (+) PONV and history of anesthetic complications  Airway Mallampati: II  TM Distance: >3 FB Neck ROM: Full    Dental no notable dental hx. (+) Teeth Intact, Dental Advisory Given   Pulmonary neg pulmonary ROS,    Pulmonary exam normal breath sounds clear to auscultation       Cardiovascular hypertension, Pt. on medications Normal cardiovascular exam+ Valvular Problems/Murmurs  Rhythm:Regular Rate:Normal     Neuro/Psych PSYCHIATRIC DISORDERS Anxiety Depression negative neurological ROS     GI/Hepatic Neg liver ROS, PUD, GERD  ,  Endo/Other  Hypothyroidism   Renal/GU negative Renal ROS     Musculoskeletal  (+) Arthritis ,   Abdominal   Peds  Hematology negative hematology ROS (+)   Anesthesia Other Findings   Reproductive/Obstetrics                             Anesthesia Physical  Anesthesia Plan  ASA: III  Anesthesia Plan: Spinal   Post-op Pain Management:  Regional for Post-op pain   Induction: Intravenous  PONV Risk Score and Plan: Ondansetron, Dexamethasone, Propofol infusion and Treatment may vary due to age or medical condition  Airway Management Planned:   Additional Equipment: None  Intra-op Plan:   Post-operative Plan:   Informed Consent: I have reviewed the patients History and Physical, chart, labs and discussed the procedure including the risks, benefits and alternatives for the proposed anesthesia with the patient or authorized representative who has indicated his/her understanding and acceptance.   Dental advisory given  Plan Discussed with: CRNA  Anesthesia Plan Comments:         Anesthesia Quick Evaluation

## 2018-01-13 NOTE — Discharge Instructions (Signed)
° °Dr. Frank Aluisio °Total Joint Specialist °[facility] °[address] °[place], [place] [address] °[phone] ° °TOTAL KNEE REPLACEMENT POSTOPERATIVE DIRECTIONS ° °Knee Rehabilitation, Guidelines Following Surgery  °Results after knee surgery are often greatly improved when you follow the exercise, range of motion and muscle strengthening exercises prescribed by your doctor. Safety measures are also important to protect the knee from further injury. Any time any of these exercises cause you to have increased pain or swelling in your knee joint, decrease the amount until you are comfortable again and slowly increase them. If you have problems or questions, call your caregiver or physical therapist for advice.  ° °HOME CARE INSTRUCTIONS  °• Remove items at home which could result in a fall. This includes throw rugs or furniture in walking pathways.  °· ICE to the affected knee every three hours for 30 minutes at a time and then as needed for pain and swelling.  Continue to use ice on the knee for pain and swelling from surgery. You may notice swelling that will progress down to the foot and ankle.  This is normal after surgery.  Elevate the leg when you are not up walking on it.   °· Continue to use the breathing machine which will help keep your temperature down.  It is common for your temperature to cycle up and down following surgery, especially at night when you are not up moving around and exerting yourself.  The breathing machine keeps your lungs expanded and your temperature down. °· Do not place pillow under knee, focus on keeping the knee straight while resting ° °DIET °You may resume your previous home diet once your are discharged from the hospital. ° °DRESSING / WOUND CARE / SHOWERING °You may shower 3 days after surgery, but keep the wounds dry during showering.  You may use an occlusive plastic wrap (Press'n Seal for example), NO SOAKING/SUBMERGING IN THE BATHTUB.  If the bandage  gets wet, change with a clean dry gauze.  If the incision gets wet, pat the wound dry with a clean towel. °You may start showering once you are discharged home but do not submerge the incision under water. Just pat the incision dry and apply a dry gauze dressing on daily. °Change the surgical dressing daily and reapply a dry dressing each time. ° °ACTIVITY °Walk with your walker as instructed. °Use walker as long as suggested by your caregivers. °Avoid periods of inactivity such as sitting longer than an hour when not asleep. This helps prevent blood clots.  °You may resume a sexual relationship in one month or when given the OK by your doctor.  °You may return to work once you are cleared by your doctor.  °Do not drive a car for 6 weeks or until released by you surgeon.  °Do not drive while taking narcotics. ° °WEIGHT BEARING °Weight bearing as tolerated with assist device (walker, cane, etc) as directed, use it as long as suggested by your surgeon or therapist, typically at least 4-6 weeks. ° °POSTOPERATIVE CONSTIPATION PROTOCOL °Constipation - defined medically as fewer than three stools per week and severe constipation as less than one stool per week. ° °One of the most common issues patients have following surgery is constipation.  Even if you have a regular bowel pattern at home, your normal regimen is likely to be disrupted due to multiple reasons following surgery.  Combination of anesthesia, postoperative narcotics, change in appetite and fluid intake all can affect your bowels.    In order to avoid complications following surgery, here are some recommendations in order to help you during your recovery period. ° °Colace (docusate) - Pick up an over-the-counter form of Colace or another stool softener and take twice a day as long as you are requiring postoperative pain medications.  Take with a full glass of water daily.  If you experience loose stools or diarrhea, hold the colace until you stool forms back  up.  If your symptoms do not get better within 1 week or if they get worse, check with your doctor. ° °Dulcolax (bisacodyl) - Pick up over-the-counter and take as directed by the product packaging as needed to assist with the movement of your bowels.  Take with a full glass of water.  Use this product as needed if not relieved by Colace only.  ° °MiraLax (polyethylene glycol) - Pick up over-the-counter to have on hand.  MiraLax is a solution that will increase the amount of water in your bowels to assist with bowel movements.  Take as directed and can mix with a glass of water, juice, soda, coffee, or tea.  Take if you go more than two days without a movement. °Do not use MiraLax more than once per day. Call your doctor if you are still constipated or irregular after using this medication for 7 days in a row. ° °If you continue to have problems with postoperative constipation, please contact the office for further assistance and recommendations.  If you experience "the worst abdominal pain ever" or develop nausea or vomiting, please contact the office immediatly for further recommendations for treatment. ° °ITCHING ° If you experience itching with your medications, try taking only a single pain pill, or even half a pain pill at a time.  You can also use Benadryl over the counter for itching or also to help with sleep.  ° °TED HOSE STOCKINGS °Wear the elastic stockings on both legs for three weeks following surgery during the day but you may remove then at night for sleeping. ° °MEDICATIONS °See your medication summary on the “After Visit Summary” that the nursing staff will review with you prior to discharge.  You may have some home medications which will be placed on hold until you complete the course of blood thinner medication.  It is important for you to complete the blood thinner medication as prescribed by your surgeon.  Continue your approved medications as instructed at time of discharge. ° °PRECAUTIONS °If  you experience chest pain or shortness of breath - call 911 immediately for transfer to the hospital emergency department.  °If you develop a fever greater that 101 F, purulent drainage from wound, increased redness or drainage from wound, foul odor from the wound/dressing, or calf pain - CONTACT YOUR SURGEON.   °                                                °FOLLOW-UP APPOINTMENTS °Make sure you keep all of your appointments after your operation with your surgeon and caregivers. You should call the office at the above phone number and make an appointment for approximately two weeks after the date of your surgery or on the date instructed by your surgeon outlined in the "After Visit Summary". ° ° °RANGE OF MOTION AND STRENGTHENING EXERCISES  °Rehabilitation of the knee is important following a knee injury or   an operation. After just a few days of immobilization, the muscles of the thigh which control the knee become weakened and shrink (atrophy). Knee exercises are designed to build up the tone and strength of the thigh muscles and to improve knee motion. Often times heat used for twenty to thirty minutes before working out will loosen up your tissues and help with improving the range of motion but do not use heat for the first two weeks following surgery. These exercises can be done on a training (exercise) mat, on the floor, on a table or on a bed. Use what ever works the best and is most comfortable for you Knee exercises include:  °• Leg Lifts - While your knee is still immobilized in a splint or cast, you can do straight leg raises. Lift the leg to 60 degrees, hold for 3 sec, and slowly lower the leg. Repeat 10-20 times 2-3 times daily. Perform this exercise against resistance later as your knee gets better.  °• Quad and Hamstring Sets - Tighten up the muscle on the front of the thigh (Quad) and hold for 5-10 sec. Repeat this 10-20 times hourly. Hamstring sets are done by pushing the foot backward against an  object and holding for 5-10 sec. Repeat as with quad sets.  °· Leg Slides: Lying on your back, slowly slide your foot toward your buttocks, bending your knee up off the floor (only go as far as is comfortable). Then slowly slide your foot back down until your leg is flat on the floor again. °· Angel Wings: Lying on your back spread your legs to the side as far apart as you can without causing discomfort.  °A rehabilitation program following serious knee injuries can speed recovery and prevent re-injury in the future due to weakened muscles. Contact your doctor or a physical therapist for more information on knee rehabilitation.  ° °IF YOU ARE TRANSFERRED TO A SKILLED REHAB FACILITY °If the patient is transferred to a skilled rehab facility following release from the hospital, a list of the current medications will be sent to the facility for the patient to continue.  When discharged from the skilled rehab facility, please have the facility set up the patient's Home Health Physical Therapy prior to being released. Also, the skilled facility will be responsible for providing the patient with their medications at time of release from the facility to include their pain medication, the muscle relaxants, and their blood thinner medication. If the patient is still at the rehab facility at time of the two week follow up appointment, the skilled rehab facility will also need to assist the patient in arranging follow up appointment in our office and any transportation needs. ° °MAKE SURE YOU:  °• Understand these instructions.  °• Get help right away if you are not doing well or get worse.  ° ° °Pick up stool softner and laxative for home use following surgery while on pain medications. °Do not submerge incision under water. °Please use good hand washing techniques while changing dressing each day. °May shower starting three days after surgery. °Please use a clean towel to pat the incision dry following showers. °Continue to  use ice for pain and swelling after surgery. °Do not use any lotions or creams on the incision until instructed by your surgeon. ° °

## 2018-01-13 NOTE — Anesthesia Procedure Notes (Signed)
Anesthesia Regional Block: Adductor canal block   Pre-Anesthetic Checklist: ,, timeout performed, Correct Patient, Correct Site, Correct Laterality, Correct Procedure, Correct Position, site marked, Risks and benefits discussed,  Surgical consent,  Pre-op evaluation,  At surgeon's request and post-op pain management  Laterality: Left  Prep: chloraprep       Needles:  Injection technique: Single-shot  Needle Type: Stimiplex     Needle Length: 9cm  Needle Gauge: 21     Additional Needles:   Procedures:,,,, ultrasound used (permanent image in chart),,,,  Narrative:  Start time: 01/13/2018 7:53 AM End time: 01/13/2018 7:55 AM Injection made incrementally with aspirations every 5 mL.  Performed by: Personally  Anesthesiologist: [name], MD  Additional Notes: BP cuff, EKG monitors applied. Sedation begun. Artery and nerve location verified with U/S and anesthetic injected incrementally, slowly, and after negative aspirations under direct u/s guidance. Good fascial /perineural spread. Tolerated well.

## 2018-01-13 NOTE — Interval H&P Note (Signed)
History and Physical Interval Note:  01/13/2018 6:32 AM  Tracey Kramer  has presented today for surgery, with the diagnosis of left knee osteoarthritis  The various methods of treatment have been discussed with the patient and family. After consideration of risks, benefits and other options for treatment, the patient has consented to  Procedure(s): LEFT TOTAL KNEE ARTHROPLASTY (Left) as a surgical intervention .  The patient's history has been reviewed, patient examined, no change in status, stable for surgery.  I have reviewed the patient's chart and labs.  Questions were answered to the patient's satisfaction.     Homero Fellers Ulices Maack

## 2018-01-14 ENCOUNTER — Encounter (HOSPITAL_COMMUNITY): Payer: Self-pay | Admitting: Orthopedic Surgery

## 2018-01-14 LAB — BASIC METABOLIC PANEL
ANION GAP: 5 (ref 5–15)
BUN: 13 mg/dL (ref 8–23)
CALCIUM: 8.4 mg/dL — AB (ref 8.9–10.3)
CO2: 28 mmol/L (ref 22–32)
CREATININE: 0.62 mg/dL (ref 0.44–1.00)
Chloride: 110 mmol/L (ref 98–111)
GFR calc non Af Amer: >60 mL/min (ref >60)
Glucose, Bld: 120 mg/dL — ABNORMAL HIGH (ref 70–99)
Potassium: 4 mmol/L (ref 3.5–5.1)
SODIUM: 143 mmol/L (ref 135–145)

## 2018-01-14 LAB — CBC
HCT: 32.5 % — ABNORMAL LOW (ref 36.0–46.0)
HEMOGLOBIN: 10.5 g/dL — AB (ref 12.0–15.0)
MCH: 30 pg (ref 26.0–34.0)
MCHC: 32.3 g/dL (ref 30.0–36.0)
MCV: 92.9 fL (ref 80.0–100.0)
NRBC: 0 % (ref 0.0–0.2)
Platelets: 283 10*3/uL (ref 150–400)
RBC: 3.5 MIL/uL — ABNORMAL LOW (ref 3.87–5.11)
RDW: 12.7 % (ref 11.5–15.5)
WBC: 11.8 10*3/uL — AB (ref 4.0–10.5)

## 2018-01-14 MED ORDER — ASPIRIN 325 MG PO TBEC: 325.0000 mg | DELAYED_RELEASE_TABLET | Freq: Two times a day (BID) | ORAL | 0 refills | Status: DC

## 2018-01-14 MED ORDER — SODIUM CHLORIDE 0.9 % IV BOLUS
250.0000 mL | Freq: Once | INTRAVENOUS | Status: AC
  Administered 2018-01-14: 250 mL via INTRAVENOUS

## 2018-01-14 MED ORDER — METHOCARBAMOL 500 MG PO TABS: 500.0000 mg | ORAL_TABLET | Freq: Four times a day (QID) | ORAL | 0 refills | Status: DC | PRN

## 2018-01-14 MED ORDER — OXYCODONE HCL 5 MG PO TABS: 5.0000 mg | ORAL_TABLET | Freq: Four times a day (QID) | ORAL | 0 refills | Status: DC | PRN

## 2018-01-14 MED ORDER — TRAMADOL HCL 50 MG PO TABS: 50.0000 mg | ORAL_TABLET | Freq: Four times a day (QID) | ORAL | 0 refills | Status: DC | PRN

## 2018-01-14 MED ORDER — GABAPENTIN 300 MG PO CAPS: 300.0000 mg | ORAL_CAPSULE | Freq: Three times a day (TID) | ORAL | 0 refills | Status: DC

## 2018-01-14 NOTE — Progress Notes (Signed)
Physical Therapy Treatment Patient Details Name: Tracey Kramer MRN: [id number] DOB: 1945-09-10 Today's Date: 01/14/2018    History of Present Illness Pt is a 72 year old female s/p L TKA    PT Comments    Pt ambulated in hallway and performed LE exercises.  Pt anticipates possible d/c home later today.   Follow Up Recommendations  Follow surgeon's recommendation for DC plan and follow-up therapies     Equipment Recommendations  None recommended by PT    Recommendations for Other Services       Precautions / Restrictions Precautions Precautions: Fall;Knee Required Braces or Orthoses: Knee Immobilizer - Left Restrictions Weight Bearing Restrictions: No Other Position/Activity Restrictions: WBAT    Mobility  Bed Mobility Overal bed mobility: Needs Assistance Bed Mobility: Supine to Sit     Supine to sit: Min guard     General bed mobility comments: verbal cues for self assist  Transfers Overall transfer level: Needs assistance Equipment used: Rolling walker (2 wheeled) Transfers: Sit to/from Stand Sit to Stand: Min guard         General transfer comment: verbal cues for UE and LE positioning  Ambulation/Gait Ambulation/Gait assistance: Min guard Gait Distance (Feet): 80 Feet Assistive device: Rolling walker (2 wheeled) Gait Pattern/deviations: Step-to pattern;Step-through pattern;Antalgic     General Gait Details: verbal cues for sequence, RW positioning, step length, no buckling observed today, distance to tolerance   Stairs             Wheelchair Mobility    Modified [name] (Stroke Patients Only)       Balance                                            Cognition Arousal/Alertness: Awake/alert Behavior During Therapy: WFL for tasks assessed/performed Overall Cognitive Status: Within Functional Limits for tasks assessed                                        Exercises Total Joint  Exercises Ankle Circles/Pumps: AROM;10 reps;Both Quad Sets: AROM;10 reps;Both Short Arc Quad: 10 reps;Left;AROM Heel Slides: AAROM;10 reps;Left Hip ABduction/ADduction: AAROM;10 reps;Left Straight Leg Raises: AAROM;10 reps;Left Goniometric ROM: aprox -5-80* AAROM L knee    General Comments        Pertinent Vitals/Pain Pain Assessment: 0-10 Pain Score: 3  Pain Location: L knee Pain Descriptors / Indicators: Aching;Sore Pain Intervention(s): Limited activity within patient's tolerance;Monitored during session;Repositioned;Ice applied    Home Living                      Prior Function            PT Goals (current goals can now be found in the care plan section) Progress towards PT goals: Progressing toward goals    Frequency    7X/week      PT Plan Current plan remains appropriate    Co-evaluation              AM-PAC PT "6 Clicks" Daily Activity  Outcome Measure  Difficulty turning over in bed (including adjusting bedclothes, sheets and blankets)?: A Little Difficulty moving from lying on back to sitting on the side of the bed? : A Little Difficulty sitting down on and standing up from a chair with arms (  e.g., wheelchair, bedside commode, etc,.)?: A Little Help needed moving to and from a bed to chair (including a wheelchair)?: A Little Help needed walking in hospital room?: A Little Help needed climbing 3-5 steps with a railing? : A Lot 6 Click Score: 17    End of Session Equipment Utilized During Treatment: Gait belt;Left knee immobilizer Activity Tolerance: Patient tolerated treatment well Patient left: in chair;with call bell/phone within reach;with family/visitor present   PT Visit Diagnosis: Other abnormalities of gait and mobility (R26.89)     Time: 6962-9528 PT Time Calculation (min) (ACUTE ONLY): 23 min  Charges:  $Gait Training: 8-22 mins $Therapeutic Exercise: 8-22 mins                     [name], PT, DPT Acute  Rehabilitation Services Office: [phone] Pager: [phone]  [name] 01/14/2018, 1:07 PM

## 2018-01-14 NOTE — Progress Notes (Signed)
Physical Therapy Treatment Patient Details Name: Tracey Kramer MRN: [id number] DOB: [birth year] Today's Date: 01/14/2018    History of Present Illness Pt is a 72 year old female s/p L TKA    PT Comments    Pt's spouse educated on applying KI and assisted pt with mobility this afternoon.  Pt also provided with HEP.  Pt and spouse had no further questions.  Pt did voice concerns over pain after previous session however was not having increased pain during this session (pain reported as tolerable and appeared tolerable).  RN notified of pt's concern of pain control upon d/c.     Follow Up Recommendations  Follow surgeon's recommendation for DC plan and follow-up therapies     Equipment Recommendations  None recommended by PT    Recommendations for Other Services       Precautions / Restrictions Precautions Precautions: Fall;Knee Required Braces or Orthoses: Knee Immobilizer - Left Restrictions Weight Bearing Restrictions: No Other Position/Activity Restrictions: WBAT    Mobility  Bed Mobility Overal bed mobility: Needs Assistance Bed Mobility: Supine to Sit     Supine to sit: Min guard     General bed mobility comments: pt up in recliner on arrival  Transfers Overall transfer level: Needs assistance Equipment used: Rolling walker (2 wheeled) Transfers: Sit to/from Stand Sit to Stand: Min guard         General transfer comment: verbal cues for UE and LE positioning; spouse provided min/guard with gait belt  Ambulation/Gait Ambulation/Gait assistance: Min guard Gait Distance (Feet): 120 Feet Assistive device: Rolling walker (2 wheeled) Gait Pattern/deviations: Antalgic;Step-to pattern     General Gait Details: verbal cues for sequence, RW positioning, step length, distance to tolerance, spouse ambulated with pt with gait belt   Stairs             Wheelchair Mobility    Modified [name] (Stroke Patients Only)       Balance                                             Cognition Arousal/Alertness: Awake/alert Behavior During Therapy: WFL for tasks assessed/performed Overall Cognitive Status: Within Functional Limits for tasks assessed                                        Exercises      General Comments        Pertinent Vitals/Pain Pain Assessment: 0-10 Pain Score: 4  Pain Location: L knee Pain Descriptors / Indicators: Aching;Sore Pain Intervention(s): Limited activity within patient's tolerance;Repositioned;Monitored during session;Premedicated before session;Ice applied    Home Living                      Prior Function            PT Goals (current goals can now be found in the care plan section) Progress towards PT goals: Progressing toward goals    Frequency    7X/week      PT Plan Current plan remains appropriate    Co-evaluation              AM-PAC PT "6 Clicks" Daily Activity  Outcome Measure  Difficulty turning over in bed (including adjusting bedclothes, sheets and blankets)?: A Little Difficulty moving  from lying on back to sitting on the side of the bed? : A Little Difficulty sitting down on and standing up from a chair with arms (e.g., wheelchair, bedside commode, etc,.)?: A Little Help needed moving to and from a bed to chair (including a wheelchair)?: A Little Help needed walking in hospital room?: A Little Help needed climbing 3-5 steps with a railing? : A Little 6 Click Score: 18    End of Session Equipment Utilized During Treatment: Gait belt;Left knee immobilizer Activity Tolerance: Patient tolerated treatment well Patient left: with call bell/phone within reach;in chair;with family/visitor present Nurse Communication: Mobility status PT Visit Diagnosis: Other abnormalities of gait and mobility (R26.89)     Time: 7829-5621 PT Time Calculation (min) (ACUTE ONLY): 19 min  Charges:  $Gait Training: 8-22 mins                      [name], PT, DPT Acute Rehabilitation Services Office: [phone] Pager: [phone]  [name] 01/14/2018, 3:21 PM

## 2018-01-14 NOTE — Progress Notes (Signed)
Subjective: 1 Day Post-Op Procedure(s) (LRB): LEFT TOTAL KNEE ARTHROPLASTY (Left) Patient reports pain as mild.   Patient seen in rounds by Dr. Lequita Halt. Patient is well, and has had no acute complaints or problems. Discomfort in left knee. Foley catheter removed this AM. Denies chest pain, SOB or calf pain. We will continue therapy today.   Objective: Vital signs in last 24 hours: Temp:  [97.3 F (36.3 C)-97.9 F (36.6 C)] 97.5 F (36.4 C) (10/29 0156) Pulse Rate:  [58-79] 65 (10/29 0535) Resp:  [9-18] 14 (10/28 1253) BP: (105-147)/(38-59) 105/38 (10/29 0535) SpO2:  [91 %-100 %] 100 % (10/29 0535)  Intake/Output from previous day:  Intake/Output Summary (Last 24 hours) at 01/14/2018 0720 Last data filed at 01/14/2018 1610 Gross per 24 hour  Intake 3594.1 ml  Output 2995 ml  Net 599.1 ml    Labs: Recent Labs    01/14/18 0542  HGB 10.5*   Recent Labs    01/14/18 0542  WBC 11.8*  RBC 3.50*  HCT 32.5*  PLT 283   Recent Labs    01/14/18 0542  NA 143  K 4.0  CL 110  CO2 28  BUN 13  CREATININE 0.62  GLUCOSE 120*  CALCIUM 8.4*   Exam: General - Patient is Alert and Oriented Extremity - Neurologically intact Neurovascular intact Sensation intact distally Dorsiflexion/Plantar flexion intact Dressing - dressing C/D/I Motor Function - intact, moving foot and toes well on exam.   Past Medical History:  Diagnosis Date  . Acid reflux occasional  . Acute lateral meniscal tear LEFT KNEE  . Anxiety   . AR (aortic regurgitation) 11/06/2017   Moderate, noted on ECHO  . Arthritis   . Constipation   . Depression   . Heart murmur   . Heart palpitations   . HOH (hard of hearing)   . Hyperlipidemia   . Hypertension   . Hypothyroidism   . IBS (irritable bowel syndrome)   . MR (mitral regurgitation) 11/06/2017   Mild, noted on ECHO  . PONV (postoperative nausea and vomiting)   . PR (pulmonary regurgitation) 11/11/2017   Mild, noted on ECHO  . Thyroid  disease    hypothyroidism   . TR (tricuspid regurgitation) 11/11/2017   Mild, noted on ECHO    Assessment/Plan: 1 Day Post-Op Procedure(s) (LRB): LEFT TOTAL KNEE ARTHROPLASTY (Left) Principal Problem:   OA (osteoarthritis) of knee  Estimated body mass index is 25.54 kg/m as calculated from the following:   Height as of this encounter: 5\' 3"  (1.6 m).   Weight as of this encounter: 65.4 kg. Advance diet Up with therapy D/C IV fluids  Anticipated LOS equal to or greater than 2 midnights due to - Age 47 and older with one or more of the following:  - Obesity  - Expected need for hospital services (PT, OT, Nursing) required for safe  discharge  - Anticipated need for postoperative skilled nursing care or inpatient rehab  - Active co-morbidities: None OR   - Unanticipated findings during/Post Surgery: None  - Patient is a high risk of re-admission due to: None    DVT Prophylaxis - Aspirin Weight bearing as tolerated. D/C O2 and pulse ox and try on room air. Hemovac pulled without difficulty, will continue therapy today.  BP soft this AM, 250 mL bolus ordered.  Plan is to go Home after hospital stay. Possible discharge this afternoon if progresses with therapy and meeting her goals. Scheduled for outpatient physical therapy at [facility]. Follow-up  in the office in 2 weeks with Dr. Lequita Halt.  Arther Abbott, PA-C Orthopedic Surgery 01/14/2018, 7:20 AM

## 2018-01-20 NOTE — Discharge Summary (Signed)
Physician Discharge Summary   Patient ID: Tracey Kramer MRN: [id number] DOB/AGE: [birth year] 72 y.o.  Admit date: 01/13/2018 Discharge date: 01/14/2018  Primary Diagnosis: Osteoarthritis, left knee   Admission Diagnoses:  Past Medical History:  Diagnosis Date  . Acid reflux occasional  . Acute lateral meniscal tear LEFT KNEE  . Anxiety   . AR (aortic regurgitation) 11/06/2017   Moderate, noted on ECHO  . Arthritis   . Constipation   . Depression   . Heart murmur   . Heart palpitations   . HOH (hard of hearing)   . Hyperlipidemia   . Hypertension   . Hypothyroidism   . IBS (irritable bowel syndrome)   . MR (mitral regurgitation) 11/06/2017   Mild, noted on ECHO  . PONV (postoperative nausea and vomiting)   . PR (pulmonary regurgitation) 11/11/2017   Mild, noted on ECHO  . Thyroid disease    hypothyroidism   . TR (tricuspid regurgitation) 11/11/2017   Mild, noted on ECHO   Discharge Diagnoses:   Principal Problem:   OA (osteoarthritis) of knee  Estimated body mass index is 25.54 kg/m as calculated from the following:   Height as of this encounter: '5\' 3"'  (1.6 m).   Weight as of this encounter: 65.4 kg.  Procedure:  Procedure(s) (LRB): LEFT TOTAL KNEE ARTHROPLASTY (Left)   Consults: None  HPI:  Tracey Kramer is a 72 y.o. year old female with end stage OA of her left knee with progressively worsening pain and dysfunction. She has constant pain, with activity and at rest and significant functional deficits with difficulties even with ADLs. She has had extensive non-op management including analgesics, injections of cortisone and viscosupplements, and home exercise program, but remains in significant pain with significant dysfunction. Radiographs show bone on bone arthritis lateral. She presents now for left Total Knee Arthroplasty.   Laboratory Data: Admission on 01/13/2018, Discharged on 01/14/2018  Component Date Value Ref Range Status  . WBC 01/14/2018 11.8*  4.0 - 10.5 K/uL Final  . RBC 01/14/2018 3.50* 3.87 - 5.11 MIL/uL Final  . Hemoglobin 01/14/2018 10.5* 12.0 - 15.0 g/dL Final  . HCT 01/14/2018 32.5* 36.0 - 46.0 % Final  . MCV 01/14/2018 92.9  80.0 - 100.0 fL Final  . MCH 01/14/2018 30.0  26.0 - 34.0 pg Final  . MCHC 01/14/2018 32.3  30.0 - 36.0 g/dL Final  . RDW 01/14/2018 12.7  11.5 - 15.5 % Final  . Platelets 01/14/2018 283  150 - 400 K/uL Final  . nRBC 01/14/2018 0.0  0.0 - 0.2 % Final   Performed at [facility], [place] [address], [place], [place] [address]  . Sodium 01/14/2018 143  135 - 145 mmol/L Final  . Potassium 01/14/2018 4.0  3.5 - 5.1 mmol/L Final  . Chloride 01/14/2018 110  98 - 111 mmol/L Final  . CO2 01/14/2018 28  22 - 32 mmol/L Final  . Glucose, Bld 01/14/2018 120* 70 - 99 mg/dL Final  . BUN 01/14/2018 13  8 - 23 mg/dL Final  . Creatinine, Ser 01/14/2018 0.62  0.44 - 1.00 mg/dL Final  . Calcium 01/14/2018 8.4* 8.9 - 10.3 mg/dL Final  . GFR calc non Af Amer 01/14/2018 >60  >60 mL/min Final  . GFR calc Af Amer 01/14/2018 >60  >60 mL/min Final   Comment: (NOTE) The eGFR has been calculated using the CKD EPI equation. This calculation has not been validated in all clinical situations. eGFR's persistently <60 mL/min signify possible Chronic Kidney  Disease.   Georgiann Hahn gap 01/14/2018 5  5 - 15 Final   Performed at [facility], [place] [address], [place], [place] [address]  Hospital Outpatient Visit on 01/06/2018  Component Date Value Ref Range Status  . aPTT 01/06/2018 28  24 - 36 seconds Final   Performed at [facility], [place] [address] [place], [place] [address]  . WBC 01/06/2018 5.0  4.0 - 10.5 K/uL Final  . RBC 01/06/2018 4.37  3.87 - 5.11 MIL/uL Final  . Hemoglobin 01/06/2018 13.1  12.0 - 15.0 g/dL Final  . HCT 01/06/2018 39.6  36.0 - 46.0 % Final  . MCV 01/06/2018 90.6  80.0 - 100.0 fL Final  . MCH 01/06/2018 30.0  26.0 - 34.0 pg Final  . MCHC 01/06/2018  33.1  30.0 - 36.0 g/dL Final  . RDW 01/06/2018 12.4  11.5 - 15.5 % Final  . Platelets 01/06/2018 325  150 - 400 K/uL Final  . nRBC 01/06/2018 0.0  0.0 - 0.2 % Final   Performed at [facility], [place] [address] [place], [place] [address]  . Sodium 01/06/2018 141  135 - 145 mmol/L Final  . Potassium 01/06/2018 4.0  3.5 - 5.1 mmol/L Final  . Chloride 01/06/2018 105  98 - 111 mmol/L Final  . CO2 01/06/2018 29  22 - 32 mmol/L Final  . Glucose, Bld 01/06/2018 101* 70 - 99 mg/dL Final  . BUN 01/06/2018 14  8 - 23 mg/dL Final  . Creatinine, Ser 01/06/2018 0.66  0.44 - 1.00 mg/dL Final  . Calcium 01/06/2018 9.8  8.9 - 10.3 mg/dL Final  . Total Protein 01/06/2018 7.2  6.5 - 8.1 g/dL Final  . Albumin 01/06/2018 4.1  3.5 - 5.0 g/dL Final  . AST 01/06/2018 18  15 - 41 U/L Final  . ALT 01/06/2018 16  0 - 44 U/L Final  . Alkaline Phosphatase 01/06/2018 56  38 - 126 U/L Final  . Total Bilirubin 01/06/2018 0.6  0.3 - 1.2 mg/dL Final  . GFR calc non Af Amer 01/06/2018 >60  >60 mL/min Final  . GFR calc Af Amer 01/06/2018 >60  >60 mL/min Final   Comment: (NOTE) The eGFR has been calculated using the CKD EPI equation. This calculation has not been validated in all clinical situations. eGFR's persistently <60 mL/min signify possible Chronic Kidney Disease.   Georgiann Hahn gap 01/06/2018 7  5 - 15 Final   Performed at [facility], [place] [address], [place], [place] [address]  . Prothrombin Time 01/06/2018 11.9  11.4 - 15.2 seconds Final  . INR 01/06/2018 0.89   Final   Performed at [facility], [place] [address], [place], [place] [address]  . ABO/RH(D) 01/06/2018 A POS   Final  . Antibody Screen 01/06/2018 NEG   Final  . Sample Expiration 01/06/2018 01/16/2018   Final  . Extend sample reason 01/06/2018    Final                   Value:NO TRANSFUSIONS OR PREGNANCY IN THE PAST 3 MONTHS Performed at [facility], [place] [address],  [place], [place] [address]   . MRSA, PCR 01/06/2018 NEGATIVE  NEGATIVE Final  . Staphylococcus aureus 01/06/2018 NEGATIVE  NEGATIVE Final   Comment: (NOTE) The Xpert SA Assay (FDA approved for NASAL specimens in patients 58 years of age and older), is one component of a comprehensive surveillance program. It is not  intended to diagnose infection nor to guide or monitor treatment. Performed at [facility], [place] [address], [place], [place] [address]   . ABO/RH(D) 01/06/2018    Final                   Value:A POS Performed at [facility], [place] [address] [place], [place] [address]      X-Rays:No results found.  EKG: Orders placed or performed during the hospital encounter of 01/06/18  . EKG 12 lead  . EKG 12 lead     Hospital Course: Tracey Kramer is a 72 y.o. who was admitted to [facility]. They were brought to the operating room on 01/13/2018 and underwent Procedure(s): LEFT TOTAL KNEE ARTHROPLASTY.  Patient tolerated the procedure well and was later transferred to the recovery room and then to the orthopaedic floor for postoperative care. They were given PO and IV analgesics for pain control following their surgery. They were given 24 hours of postoperative antibiotics of  Anti-infectives (From admission, onward)   Start     Dose/Rate Route Frequency Ordered Stop   01/13/18 1430  ceFAZolin (ANCEF) IVPB 1 g/50 mL premix     1 g 100 mL/hr over 30 Minutes Intravenous Every 6 hours 01/13/18 1046 01/13/18 2111   01/13/18 0600  ceFAZolin (ANCEF) IVPB 2g/100 mL premix     2 g 200 mL/hr over 30 Minutes Intravenous On call to O.R. 01/13/18 9373 01/13/18 4287     and started on DVT prophylaxis in the form of Aspirin.   PT and OT were ordered for total joint protocol. Discharge planning consulted to help with postop disposition and equipment needs.  Patient had a good night on the evening of surgery. They started to get up OOB with therapy on  POD #0. Pt was seen during rounds and was ready to go home pending progress with therapy. BP was noted to be soft, 250 mL bolus ordered. Hemovac drain was pulled without difficulty. She worked with therapy on POD #1 and was meeting her goals. Pt was discharged to home later that day in stable condition.  Diet: Regular diet Activity: WBAT Follow-up: in 2 weeks with Dr. Wynelle Link Disposition: Home with outpatient PT at [facility] Discharged Condition: stable   Discharge Instructions    Call MD / Call 911   Complete by:  As directed    If you experience chest pain or shortness of breath, CALL 911 and be transported to the hospital emergency room.  If you develope a fever above 101 F, pus (white drainage) or increased drainage or redness at the wound, or calf pain, call your surgeon's office.   Change dressing   Complete by:  As directed    Change dressing on Wednesday, then change the dressing daily with sterile 4 x 4 inch gauze dressing and apply TED hose.   Constipation Prevention   Complete by:  As directed    Drink plenty of fluids.  Prune juice may be helpful.  You may use a stool softener, such as Colace (over the counter) 100 mg twice a day.  Use MiraLax (over the counter) for constipation as needed.   Diet - low sodium heart healthy   Complete by:  As directed    Discharge instructions   Complete by:  As directed    Dr. Gaynelle Arabian Total Joint Specialist [facility] [address], [place],  [address] [phone]  TOTAL KNEE REPLACEMENT POSTOPERATIVE  DIRECTIONS  Knee Rehabilitation, Guidelines Following Surgery  Results after knee surgery are often greatly improved when you follow the exercise, range of motion and muscle strengthening exercises prescribed by your doctor. Safety measures are also important to protect the knee from further injury. Any time any of these exercises cause you to have increased pain or swelling in your knee joint, decrease  the amount until you are comfortable again and slowly increase them. If you have problems or questions, call your caregiver or physical therapist for advice.   HOME CARE INSTRUCTIONS  Remove items at home which could result in a fall. This includes throw rugs or furniture in walking pathways.  ICE to the affected knee every three hours for 30 minutes at a time and then as needed for pain and swelling.  Continue to use ice on the knee for pain and swelling from surgery. You may notice swelling that will progress down to the foot and ankle.  This is normal after surgery.  Elevate the leg when you are not up walking on it.   Continue to use the breathing machine which will help keep your temperature down.  It is common for your temperature to cycle up and down following surgery, especially at night when you are not up moving around and exerting yourself.  The breathing machine keeps your lungs expanded and your temperature down. Do not place pillow under knee, focus on keeping the knee straight while resting   DIET You may resume your previous home diet once your are discharged from the hospital.  DRESSING / WOUND CARE / SHOWERING You may shower 3 days after surgery, but keep the wounds dry during showering.  You may use an occlusive plastic wrap (Press'n Seal for example), NO SOAKING/SUBMERGING IN THE BATHTUB.  If the bandage gets wet, change with a clean dry gauze.  If the incision gets wet, pat the wound dry with a clean towel. You may start showering once you are discharged home but do not submerge the incision under water. Just pat the incision dry and apply a dry gauze dressing on daily. Change the surgical dressing daily and reapply a dry dressing each time.  ACTIVITY Walk with your walker as instructed. Use walker as long as suggested by your caregivers. Avoid periods of inactivity such as sitting longer than an hour when not asleep. This helps prevent blood clots.  You may resume a sexual  relationship in one month or when given the OK by your doctor.  You may return to work once you are cleared by your doctor.  Do not drive a car for 6 weeks or until released by you surgeon.  Do not drive while taking narcotics.  WEIGHT BEARING Weight bearing as tolerated with assist device (walker, cane, etc) as directed, use it as long as suggested by your surgeon or therapist, typically at least 4-6 weeks.  POSTOPERATIVE CONSTIPATION PROTOCOL Constipation - defined medically as fewer than three stools per week and severe constipation as less than one stool per week.  One of the most common issues patients have following surgery is constipation.  Even if you have a regular bowel pattern at home, your normal regimen is likely to be disrupted due to multiple reasons following surgery.  Combination of anesthesia, postoperative narcotics, change in appetite and fluid intake all can affect your bowels.  In order to avoid complications following surgery, here are some recommendations in order to help you during your recovery period.  Colace (docusate) - Pick  up an over-the-counter form of Colace or another stool softener and take twice a day as long as you are requiring postoperative pain medications.  Take with a full glass of water daily.  If you experience loose stools or diarrhea, hold the colace until you stool forms back up.  If your symptoms do not get better within 1 week or if they get worse, check with your doctor.  Dulcolax (bisacodyl) - Pick up over-the-counter and take as directed by the product packaging as needed to assist with the movement of your bowels.  Take with a full glass of water.  Use this product as needed if not relieved by Colace only.   MiraLax (polyethylene glycol) - Pick up over-the-counter to have on hand.  MiraLax is a solution that will increase the amount of water in your bowels to assist with bowel movements.  Take as directed and can mix with a glass of water, juice,  soda, coffee, or tea.  Take if you go more than two days without a movement. Do not use MiraLax more than once per day. Call your doctor if you are still constipated or irregular after using this medication for 7 days in a row.  If you continue to have problems with postoperative constipation, please contact the office for further assistance and recommendations.  If you experience "the worst abdominal pain ever" or develop nausea or vomiting, please contact the office immediatly for further recommendations for treatment.  ITCHING  If you experience itching with your medications, try taking only a single pain pill, or even half a pain pill at a time.  You can also use Benadryl over the counter for itching or also to help with sleep.   TED HOSE STOCKINGS Wear the elastic stockings on both legs for three weeks following surgery during the day but you may remove then at night for sleeping.  MEDICATIONS See your medication summary on the "After Visit Summary" that the nursing staff will review with you prior to discharge.  You may have some home medications which will be placed on hold until you complete the course of blood thinner medication.  It is important for you to complete the blood thinner medication as prescribed by your surgeon.  Continue your approved medications as instructed at time of discharge.  PRECAUTIONS If you experience chest pain or shortness of breath - call 911 immediately for transfer to the hospital emergency department.  If you develop a fever greater that 101 F, purulent drainage from wound, increased redness or drainage from wound, foul odor from the wound/dressing, or calf pain - CONTACT YOUR SURGEON.                                                   FOLLOW-UP APPOINTMENTS Make sure you keep all of your appointments after your operation with your surgeon and caregivers. You should call the office at the above phone number and make an appointment for approximately two weeks  after the date of your surgery or on the date instructed by your surgeon outlined in the "After Visit Summary".   RANGE OF MOTION AND STRENGTHENING EXERCISES  Rehabilitation of the knee is important following a knee injury or an operation. After just a few days of immobilization, the muscles of the thigh which control the knee become weakened and shrink (atrophy). Knee exercises  are designed to build up the tone and strength of the thigh muscles and to improve knee motion. Often times heat used for twenty to thirty minutes before working out will loosen up your tissues and help with improving the range of motion but do not use heat for the first two weeks following surgery. These exercises can be done on a training (exercise) mat, on the floor, on a table or on a bed. Use what ever works the best and is most comfortable for you Knee exercises include:  Leg Lifts - While your knee is still immobilized in a splint or cast, you can do straight leg raises. Lift the leg to 60 degrees, hold for 3 sec, and slowly lower the leg. Repeat 10-20 times 2-3 times daily. Perform this exercise against resistance later as your knee gets better.  Quad and Hamstring Sets - Tighten up the muscle on the front of the thigh (Quad) and hold for 5-10 sec. Repeat this 10-20 times hourly. Hamstring sets are done by pushing the foot backward against an object and holding for 5-10 sec. Repeat as with quad sets.  Leg Slides: Lying on your back, slowly slide your foot toward your buttocks, bending your knee up off the floor (only go as far as is comfortable). Then slowly slide your foot back down until your leg is flat on the floor again. Angel Wings: Lying on your back spread your legs to the side as far apart as you can without causing discomfort.  A rehabilitation program following serious knee injuries can speed recovery and prevent re-injury in the future due to weakened muscles. Contact your doctor or a physical therapist for more  information on knee rehabilitation.   IF YOU ARE TRANSFERRED TO A SKILLED REHAB FACILITY If the patient is transferred to a skilled rehab facility following release from the hospital, a list of the current medications will be sent to the facility for the patient to continue.  When discharged from the skilled rehab facility, please have the facility set up the patient's [place] prior to being released. Also, the skilled facility will be responsible for providing the patient with their medications at time of release from the facility to include their pain medication, the muscle relaxants, and their blood thinner medication. If the patient is still at the rehab facility at time of the two week follow up appointment, the skilled rehab facility will also need to assist the patient in arranging follow up appointment in our office and any transportation needs.  MAKE SURE YOU:  Understand these instructions.  Get help right away if you are not doing well or get worse.    Pick up stool softner and laxative for home use following surgery while on pain medications. Do not submerge incision under water. Please use good hand washing techniques while changing dressing each day. May shower starting three days after surgery. Please use a clean towel to pat the incision dry following showers. Continue to use ice for pain and swelling after surgery. Do not use any lotions or creams on the incision until instructed by your surgeon.   Do not put a pillow under the knee. Place it under the heel.   Complete by:  As directed    Driving restrictions   Complete by:  As directed    No driving for two weeks   TED hose   Complete by:  As directed    Use stockings (TED hose) for three weeks on both  leg(s).  You may remove them at night for sleeping.   Weight bearing as tolerated   Complete by:  As directed      Allergies as of 01/14/2018      Reactions   Amoxicillin Rash   Has patient had a  PCN reaction causing immediate rash, facial/tongue/throat swelling, SOB or lightheadedness with hypotensionNO Has patient had a PCN reaction causing severe rash involving mucus membranes or skin necrosis: NO Has patient had a PCN reaction that required hospitalization: NO Has patient had a PCN reaction occurring within the last 10 years:NO If all of the above answers are "NO", then may proceed with Cephalosporin use.      Medication List    TAKE these medications   aspirin 325 MG EC tablet Take 1 tablet (325 mg total) by mouth 2 (two) times daily. Take one tablet (325 mg) Aspirin two times a day for three weeks following surgery. Then take one baby Aspirin (81 mg) once a day for three weeks. Then discontinue aspirin.   DHA Omega 3 100 MG Caps Take 2 capsules by mouth daily.   dicyclomine 20 MG tablet Commonly known as:  BENTYL Take 20 mg by mouth 3 (three) times daily as needed for spasms.   gabapentin 300 MG capsule Commonly known as:  NEURONTIN Take 1 capsule (300 mg total) by mouth 3 (three) times daily. Gabapentin 300 mg Protocol Take a 300 mg capsule three times a day for two weeks following surgery. Then take a 300 mg capsule two times a day for two weeks.  Then take a 300 mg capsule once a day for two weeks.  Then discontinue the Gabapentin.   levothyroxine 88 MCG tablet Commonly known as:  SYNTHROID, LEVOTHROID Take 88 mcg by mouth at bedtime.   methocarbamol 500 MG tablet Commonly known as:  ROBAXIN Take 1 tablet (500 mg total) by mouth every 6 (six) hours as needed for muscle spasms.   omeprazole 20 MG capsule Commonly known as:  PRILOSEC Take 20 mg by mouth daily.   oxyCODONE 5 MG immediate release tablet Commonly known as:  Oxy IR/ROXICODONE Take 1-2 tablets (5-10 mg total) by mouth every 6 (six) hours as needed for moderate pain or severe pain.   PARoxetine 10 MG tablet Commonly known as:  PAXIL Take 10 mg by mouth daily.   polyethylene glycol  packet Commonly known as:  MIRALAX / GLYCOLAX Take 17 g by mouth daily.   simvastatin 40 MG tablet Commonly known as:  ZOCOR Take 40 mg by mouth every evening.   traMADol 50 MG tablet Commonly known as:  ULTRAM Take 1-2 tablets (50-100 mg total) by mouth every 6 (six) hours as needed for moderate pain (if insufficient response to oxycodone alone).   traZODone 100 MG tablet Commonly known as:  DESYREL Take 50 mg by mouth at bedtime.   triamterene-hydrochlorothiazide 37.5-25 MG capsule Commonly known as:  DYAZIDE Take 1 capsule by mouth daily.   TURMERIC CURCUMIN PO Take 2 capsules by mouth daily.   VICKS SINEX NA Place 1 spray into the nose at bedtime as needed (CONGESTION).            Discharge Care Instructions  (From admission, onward)         Start     Ordered   01/14/18 0000  Weight bearing as tolerated     01/14/18 0723   01/14/18 0000  Change dressing    Comments:  Change dressing on Wednesday, then change the  dressing daily with sterile 4 x 4 inch gauze dressing and apply TED hose.   01/14/18 0723         Follow-up Information    Gaynelle Arabian, MD. Schedule an appointment as soon as possible for a visit on 01/28/2018.   Specialty:  Orthopedic Surgery Contact information: [address] [place] [address] [phone]           Signed: Theresa Duty, PA-C Orthopedic Surgery 01/20/2018, 10:29 AM

## 2018-12-12 IMAGING — CT CT ABD-PELV W/ CM
2 of 5 series · 16 of 46 positions shown, 18 images · IV contrast (APPLIED)
Comparison: None.

CLINICAL DATA: Nausea and diarrhea for 3 days.

EXAM:
CT ABDOMEN AND PELVIS WITH CONTRAST
TECHNIQUE: Multidetector CT imaging of the abdomen and pelvis was performed
using the standard protocol following bolus administration of
intravenous contrast.
CONTRAST:  100mL [id number] IOPAMIDOL ([id number]) INJECTION 61%

[Series 2: axial st · axial · 0.80mm/px · z∈[-472,-42]mm · 13 of 98 slices shown, 15 images]
[im 6/98  soft-tissue]
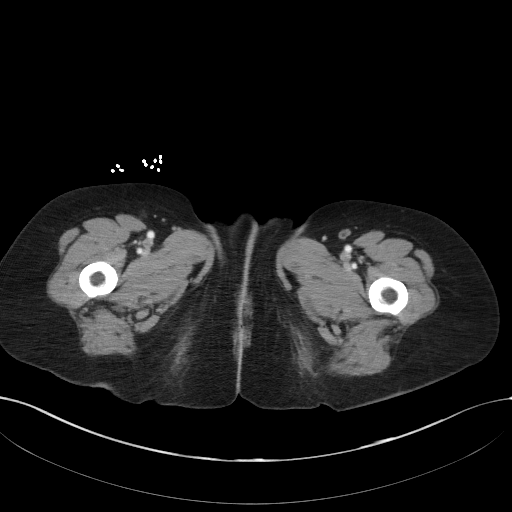
[im 6/98  bone]
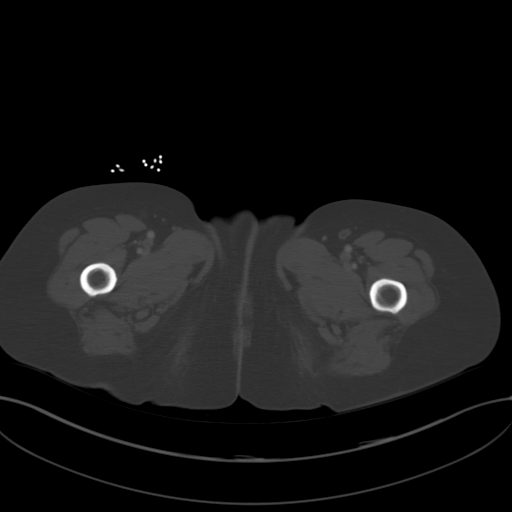
[im 16/98  soft-tissue]
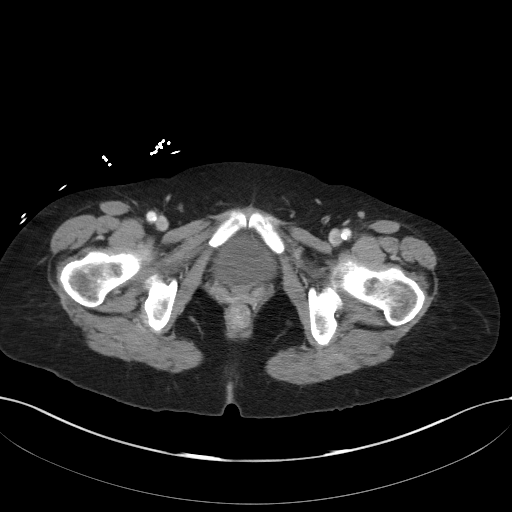
[im 21/98  soft-tissue]
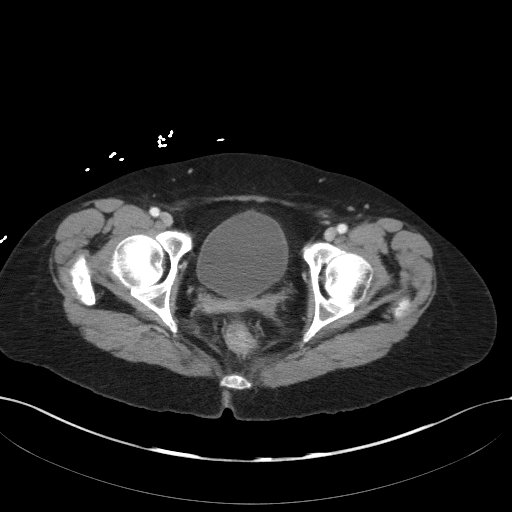
[im 26/98  soft-tissue]
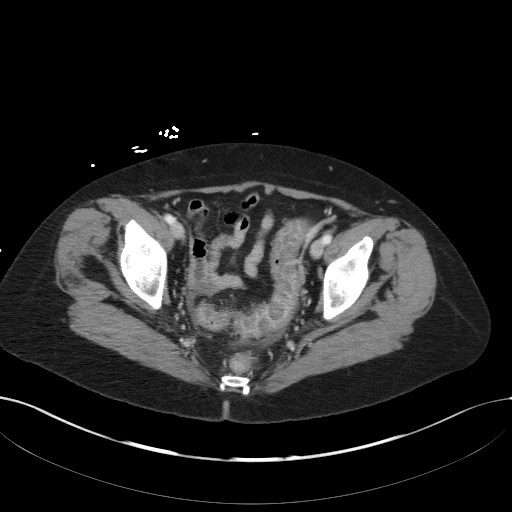
[im 36/98  soft-tissue]
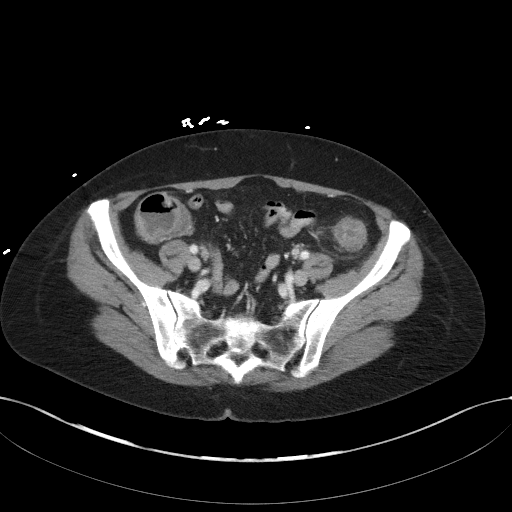
[im 41/98  soft-tissue]
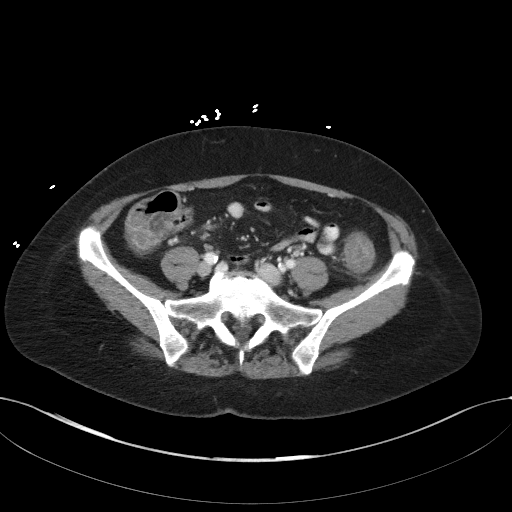
[im 52/98  soft-tissue]
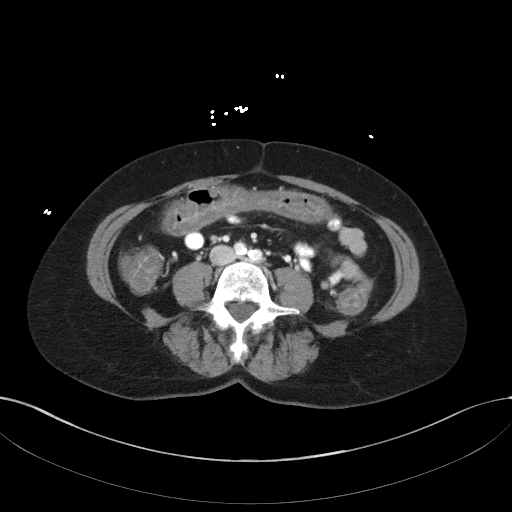
[im 57/98  soft-tissue]
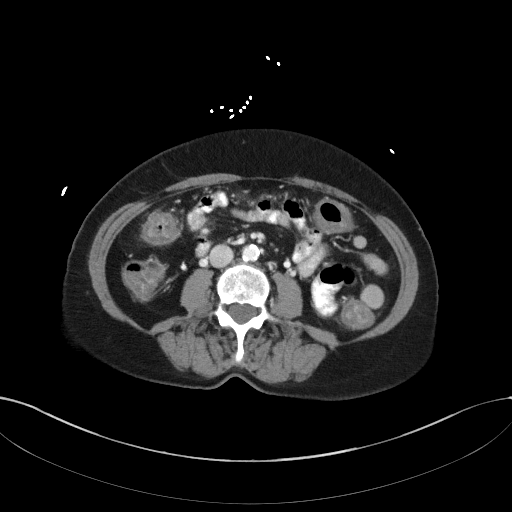
[im 62/98  soft-tissue]
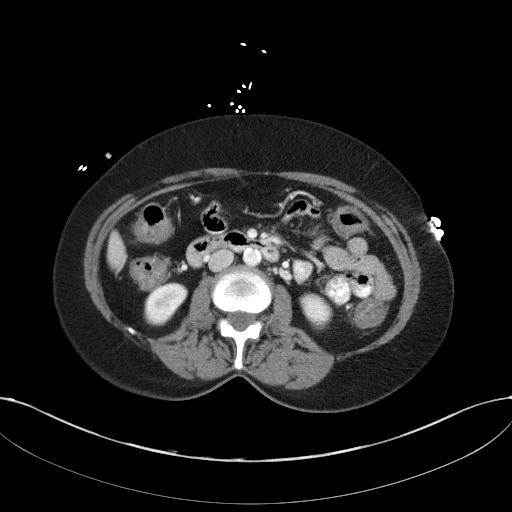
[im 62/98  bone]
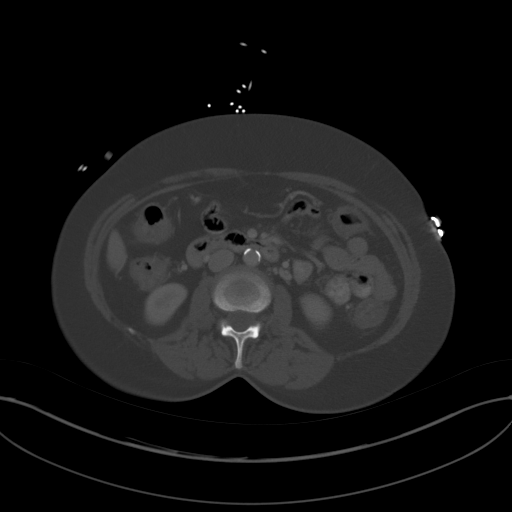
[im 72/98  soft-tissue]
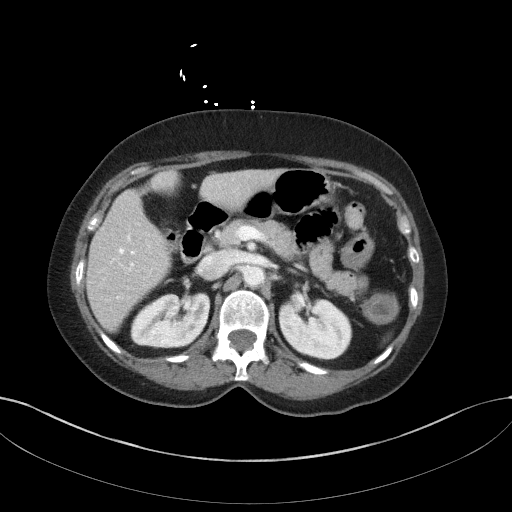
[im 77/98  soft-tissue]
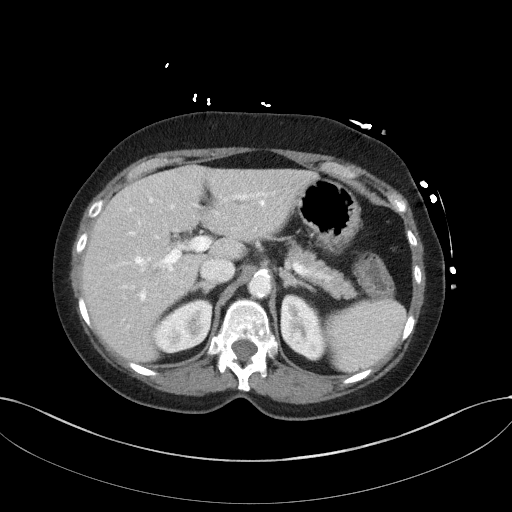
[im 82/98  soft-tissue]
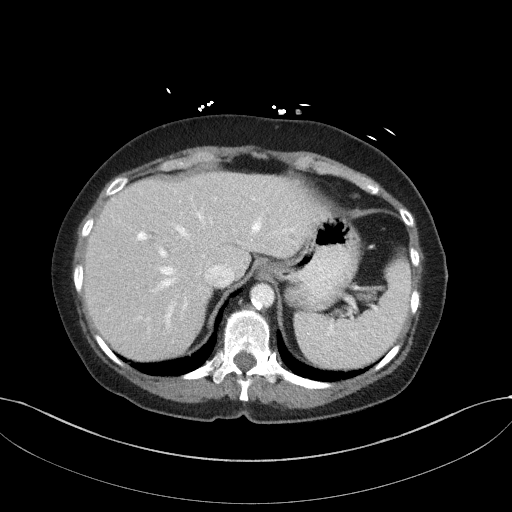
[im 92/98  soft-tissue]
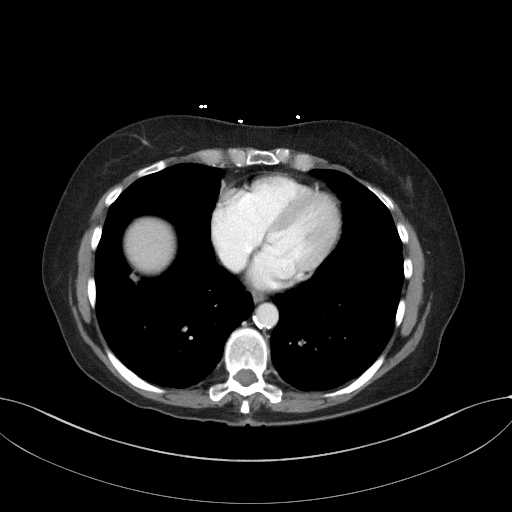

[Series 5: coronal st · coronal · 0.87mm/px · 3 of 78 slices shown]
[im 26/78  soft-tissue]
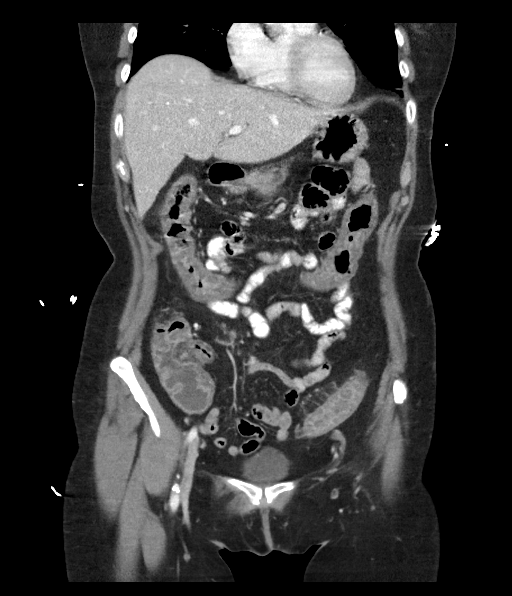
[im 35/78  soft-tissue]
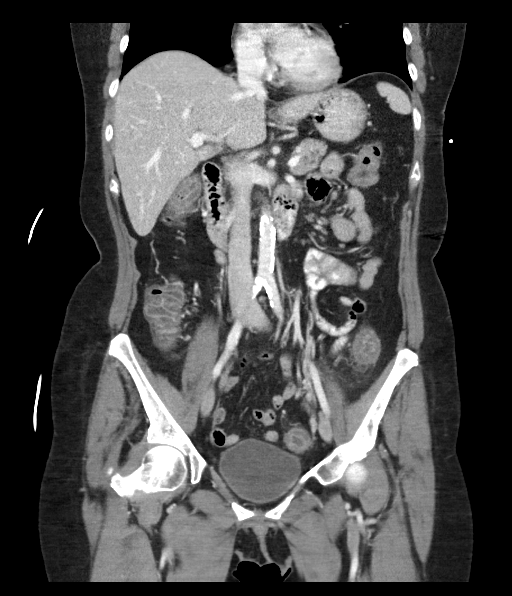
[im 43/78  soft-tissue]
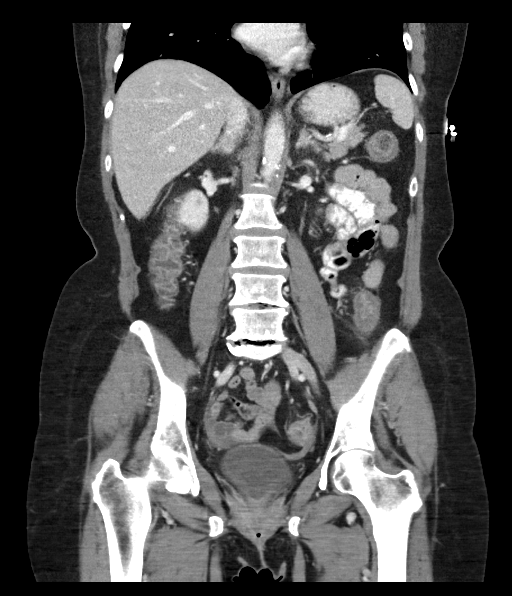

[16 of 46 positions shown; findings below may reference images not displayed]

FINDINGS: Lower chest: Lung bases are clear.

Hepatobiliary: No focal hepatic lesion. Postcholecystectomy. No
biliary dilatation.

Pancreas: The pancreas head and uncinate normal. No pancreatic duct
dilatation. In the body pancreas, there is low-attenuation cystic
change over approximately 1.5 cm region (image 25, series 2). No
mass lesion present. No duct dilatation. Tail the pancreas normal.

Spleen: Normal spleen

Adrenals/urinary tract: Adrenal glands and kidneys are normal. The
ureters and bladder normal.

Stomach/Bowel: Stomach, duodenum, small-bowel are normal. Terminal
ileum is normal. Appendix not identified. There is submucosal edema
involving the ascending, transverse, and descending colon as well as
the rectosigmoid colon. Small amount fluid along the descending
colon pericolic gutter (image 60, series 2). The bowel wall
thickening is seen to 6 mm in the descending colon for example on
image 60, series 2. Submucosal mucosal edema in the ascending colon
is exampled image 40, series 5.

Trace amount free fluid along the sigmoid colon in the pelvis (image
73, series 2). No focal lesion. No diverticulosis. No fistula or
abscess.

Vascular/Lymphatic: Abdominal aorta is normal caliber with
atherosclerotic calcification. Mesenteric vessels patent. There is
no retroperitoneal or periportal lymphadenopathy. No pelvic
lymphadenopathy.

Reproductive: Post hysterectomy

Other: No peritoneal abnormality.  Small volume free fluid

Musculoskeletal: No aggressive osseous lesion. Normal sacroiliac
joints.
IMPRESSION: 1. Pancolitis with submucosal edema involving the entirety of the
colon. Differential would include infectious colitis, inflammatory
bowel disease, drug reaction, and less likely ischemic colitis.
Mesenteric vessels are patent.
2. Subtle low attenuation within the mid body the pancreas is
favored benign ductal ectasia. Recommend follow-up contrast MRI in 6
months per consensus criteria. This recommendation follows ACR
consensus guidelines: Management of Incidental Pancreatic Cysts: A
White Paper of the ACR Incidental Findings Committee. [HOSPITAL] 8103;[DATE].

## 2021-01-16 ENCOUNTER — Emergency Department (HOSPITAL_BASED_OUTPATIENT_CLINIC_OR_DEPARTMENT_OTHER)
Admission: EM | Admit: 2021-01-16 | Discharge: 2021-01-16 | Disposition: A | Discharge status: Home / Self Care | Payer: Medicare HMO | Attending: Emergency Medicine | Admitting: Emergency Medicine

## 2021-01-16 ENCOUNTER — Other Ambulatory Visit: Payer: Self-pay

## 2021-01-16 ENCOUNTER — Encounter (HOSPITAL_BASED_OUTPATIENT_CLINIC_OR_DEPARTMENT_OTHER): Payer: Self-pay | Admitting: Emergency Medicine

## 2021-01-16 DIAGNOSIS — R55 Syncope and collapse: Secondary | ICD-10-CM | POA: Diagnosis present

## 2021-01-16 DIAGNOSIS — W19XXXA Unspecified fall, initial encounter: Secondary | ICD-10-CM | POA: Diagnosis not present

## 2021-01-16 DIAGNOSIS — Z5321 Procedure and treatment not carried out due to patient leaving prior to being seen by health care provider: Secondary | ICD-10-CM | POA: Insufficient documentation

## 2021-01-16 NOTE — ED Triage Notes (Signed)
Pt reports she had a fall from a syncopal episode x 3 days; pt has a surgical procedure (left shoulder repair) scheduled for tomorrow with [facility] [place], pt reports she was instructed to come to the ER to be cleared for surgery-she is unsure if she needs to be ruled out for a concussion or labs

## 2022-06-15 ENCOUNTER — Other Ambulatory Visit: Payer: Self-pay | Admitting: Orthopedic Surgery

## 2022-06-15 DIAGNOSIS — Z96652 Presence of left artificial knee joint: Secondary | ICD-10-CM

## 2022-07-06 ENCOUNTER — Ambulatory Visit (HOSPITAL_COMMUNITY)
Admission: RE | Admit: 2022-07-06 | Discharge: 2022-07-06 | Disposition: A | Discharge status: Home / Self Care | Payer: Medicare HMO | Source: Ambulatory Visit | Attending: Orthopedic Surgery | Admitting: Orthopedic Surgery

## 2022-07-06 ENCOUNTER — Encounter (HOSPITAL_COMMUNITY)
Admission: RE | Admit: 2022-07-06 | Discharge: 2022-07-06 | Disposition: A | Discharge status: Home / Self Care | Payer: Medicare HMO | Source: Ambulatory Visit | Attending: Orthopedic Surgery | Admitting: Orthopedic Surgery

## 2022-07-06 DIAGNOSIS — Z96652 Presence of left artificial knee joint: Secondary | ICD-10-CM

## 2022-07-06 MED ORDER — TECHNETIUM TC 99M MEDRONATE IV KIT
20.7000 | PACK | Freq: Once | INTRAVENOUS | Status: AC | PRN
  Administered 2022-07-06: 20.7 via INTRAVENOUS

## 2022-11-06 ENCOUNTER — Encounter (HOSPITAL_COMMUNITY): Admission: RE | Admit: 2022-11-06 | Payer: Medicare HMO | Source: Ambulatory Visit

## 2022-12-31 NOTE — H&P (Signed)
KNEE ADMISSION H&P  Subjective:  Chief Complaint: Left knee pain.  HPI: Tracey Kramer, 77 y.o. female presents to the clinic for a recheck of her left total knee arthroplasty. She has had the knee replacement for approximately 3 to 4 years. She reports that the knee has been giving her more trouble recently, but not every day. When it hurts, it is usually when she is getting out of a car or going up steps. She reports that the pain is not localized to one specific area but is throughout the knee.  Patient Active Problem List   Diagnosis Date Noted   OA (osteoarthritis) of knee 01/13/2018   Colitis 01/04/2017   Hyperlipidemia 01/04/2017   Hypertension 01/04/2017   Hypothyroidism 01/04/2017   Hyponatremia 01/04/2017   Dehydration 01/04/2017   Pancolitis (HCC) 01/04/2017   Acute lateral meniscal tear 06/27/2011    Past Medical History:  Diagnosis Date   Acid reflux occasional   Acute lateral meniscal tear LEFT KNEE   Anxiety    AR (aortic regurgitation) 11/06/2017   Moderate, noted on ECHO   Arthritis    Constipation    Depression    Heart murmur    Heart palpitations    HOH (hard of hearing)    Hyperlipidemia    Hypertension    Hypothyroidism    IBS (irritable bowel syndrome)    MR (mitral regurgitation) 11/06/2017   Mild, noted on ECHO   PONV (postoperative nausea and vomiting)    PR (pulmonary regurgitation) 11/11/2017   Mild, noted on ECHO   Thyroid disease    hypothyroidism    TR (tricuspid regurgitation) 11/11/2017   Mild, noted on ECHO    Past Surgical History:  Procedure Laterality Date   CHOLECYSTECTOMY  2002   COLONOSCOPY  2012   DILATION AND CURETTAGE OF UTERUS  1974   KNEE ARTHROSCOPY  06/27/2011   Procedure: ARTHROSCOPY KNEE;  Surgeon: Loanne Drilling, MD;  Location: [facility];  Service: Orthopedics;  Laterality: Left;  LEFT KNEE SCOPE WITH lateral meniscal DEBRIDEMENT     TONSILLECTOMY AND ADENOIDECTOMY  AS CHILD   TOTAL KNEE  ARTHROPLASTY Left 01/13/2018   Procedure: LEFT TOTAL KNEE ARTHROPLASTY;  Surgeon: Ollen Gross, MD;  Location: [facility];  Service: Orthopedics;  Laterality: Left;   VAGINAL HYSTERECTOMY  1976    Prior to Admission medications   Medication Sig Start Date End Date Taking? Authorizing Provider  aspirin EC 325 MG EC tablet Take 1 tablet (325 mg total) by mouth 2 (two) times daily. Take one tablet (325 mg) Aspirin two times a day for three weeks following surgery. Then take one baby Aspirin (81 mg) once a day for three weeks. Then discontinue aspirin. 01/14/18   Edmisten, Kristie L, PA  dicyclomine (BENTYL) 20 MG tablet Take 20 mg by mouth 3 (three) times daily as needed for spasms.    [provider]  Docosahexaenoic Acid (DHA OMEGA 3) 100 MG CAPS Take 2 capsules by mouth daily.    [provider]  gabapentin (NEURONTIN) 300 MG capsule Take 1 capsule (300 mg total) by mouth 3 (three) times daily. Gabapentin 300 mg Protocol Take a 300 mg capsule three times a day for two weeks following surgery. Then take a 300 mg capsule two times a day for two weeks.  Then take a 300 mg capsule once a day for two weeks.  Then discontinue the Gabapentin. 01/14/18   Edmisten, Lyn Hollingshead, PA  levothyroxine (SYNTHROID, LEVOTHROID) 88 MCG  tablet Take 88 mcg by mouth at bedtime.     [provider]  methocarbamol (ROBAXIN) 500 MG tablet Take 1 tablet (500 mg total) by mouth every 6 (six) hours as needed for muscle spasms. 01/14/18   Edmisten, Kristie L, PA  omeprazole (PRILOSEC) 20 MG capsule Take 20 mg by mouth daily.     [provider]  oxyCODONE (OXY IR/ROXICODONE) 5 MG immediate release tablet Take 1-2 tablets (5-10 mg total) by mouth every 6 (six) hours as needed for moderate pain or severe pain. 01/14/18   Edmisten, Kristie L, PA  Oxymetazoline HCl (VICKS SINEX NA) Place 1 spray into the nose at bedtime as needed (CONGESTION).    [provider]  PARoxetine (PAXIL) 10  MG tablet Take 10 mg by mouth daily.    [provider]  polyethylene glycol (MIRALAX / GLYCOLAX) packet Take 17 g by mouth daily.    [provider]  simvastatin (ZOCOR) 40 MG tablet Take 40 mg by mouth every evening.     [provider]  traMADol (ULTRAM) 50 MG tablet Take 1-2 tablets (50-100 mg total) by mouth every 6 (six) hours as needed for moderate pain (if insufficient response to oxycodone alone). 01/14/18   Edmisten, Kristie L, PA  traZODone (DESYREL) 100 MG tablet Take 50 mg by mouth at bedtime.    [provider]  triamterene-hydrochlorothiazide (DYAZIDE) 37.5-25 MG capsule Take 1 capsule by mouth daily. 12/19/16   [provider]  TURMERIC CURCUMIN PO Take 2 capsules by mouth daily.    [provider]    Allergies  Allergen Reactions   Amoxicillin Rash    Has patient had a PCN reaction causing immediate rash, facial/tongue/throat swelling, SOB or lightheadedness with hypotensionNO Has patient had a PCN reaction causing severe rash involving mucus membranes or skin necrosis: NO Has patient had a PCN reaction that required hospitalization: NO Has patient had a PCN reaction occurring within the last 10 years:NO If all of the above answers are "NO", then may proceed with Cephalosporin use.     Social History   Socioeconomic History   Marital status: Married    Spouse name: Not on file   Number of children: Not on file   Years of education: Not on file   Highest education level: Not on file  Occupational History   Not on file  Tobacco Use   Smoking status: Never   Smokeless tobacco: Never  Vaping Use   Vaping status: Never Used  Substance and Sexual Activity   Alcohol use: No   Drug use: No   Sexual activity: Not on file  Other Topics Concern   Not on file  Social History Narrative   Not on file   Social Determinants of Health   Financial Resource Strain: Not on file  Food Insecurity: Low Risk  (02/26/2022)    Received from [facility] visits prior to 05/19/2022., [facility] visits prior to 05/19/2022.   Food    Within the past 12 months, you worried that your food would run out before you got money to buy more food: Never true    Within the past 12 months, the food you bought just didn't last and you didn't have money to get more: Never true  Transportation Needs: No Transportation Needs (02/26/2022)   Received from [facility] visits prior to 05/19/2022., [facility] visits prior to 05/19/2022.   Transportation  In the past 12 months, has lack of reliable transportation kept you from medical appointments, meetings, work or from getting things needed for daily living?: No  Physical Activity: Not on file  Stress: Not on file  Social Connections: Unknown (08/01/2021)   Received from [facility], [facility]   Social Network    Social Network: Not on file  Intimate Partner Violence: Unknown (06/23/2021)   Received from [facility], [facility]   HITS    Physically Hurt: Not on file    Insult or Talk Down To: Not on file    Threaten Physical Harm: Not on file    Scream or Curse: Not on file    Tobacco Use: Unknown (12/05/2022)   [facility] from [facility]   Patient History    Smoking Tobacco Use: Never    Smokeless Tobacco Use: Unknown    Passive Exposure: Not on file   Social History   Substance and Sexual Activity  Alcohol Use No    No family history on file.  ROS  Objective:  Physical Exam: Well nourished and well developed.  General: Alert and oriented x3, cooperative and pleasant, no acute distress.  Head: normocephalic, atraumatic, neck supple.  Eyes: EOMI.  Abdomen: non-tender to palpation and soft, normoactive bowel sounds. Musculoskeletal: - Left knee shows no effusion.  - Range of motion is 0 to 130 degrees.  - She is tender along the proximal tibial area along the joint  line. - Knee is stable Calves soft and nontender. Motor function intact in LE. Strength 5/5 LE bilaterally. Neuro: Distal pulses 2+. Sensation to light touch intact in LE.  Vital signs in last 24 hours: BP: ()/()  Arterial Line BP: ()/()   Imaging Review The patient's X-rays taken 06/15/2022 (AP Both knees lateral left) show her prosthesis in good position with no periprosthetic abnormalities. 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 My concern clinically was for tibial loosening which was confirmed by the scan. We discussed treatment options including tibial vs TKA revision. She would like to proceed with revision surgery. We discussed it in detail. Information is given to [facility] for scheduling  The patient acknowledged the explanation, agreed to proceed with the plan and consent was signed. Patient is being admitted for inpatient treatment for surgery, pain control, PT, OT, prophylactic antibiotics, VTE prophylaxis, progressive ambulation and ADLs and discharge planning. The patient is planning to be discharged  home .  Anticipated LOS equal to or greater than 2 midnights due to - Age 88 and older with one or more of the following:  - Obesity  - Expected need for hospital services (PT, OT, Nursing) required for safe  discharge  - Anticipated need for postoperative skilled nursing care or inpatient rehab  - Active co-morbidities: None OR   - Unanticipated findings during/Post Surgery: Slow post-op progression: GI, pain control, mobility  - Patient is a high risk of re-admission due to: None  Therapy Plans: [facility] Disposition: Home with Son Planned DVT Prophylaxis: Aspirin 81 mg BID DME Needed: None PCP: Dennis Bast, MD (clearance received) Cardiologist: Vernona Rieger, MD (pending echo 11/04) TXA: IV Allergies: amoxicillin (rash) Anesthesia  Concerns: None BMI: 23.6 Last HgbA1c: not diabetic  Pharmacy: [facility] [place])  - Patient was instructed on what medications to stop prior to surgery. - Follow-up visit in 2 weeks with Dr. Lequita Halt -  Begin physical therapy following surgery - Pre-operative lab work as pre-surgical testing - Prescriptions will be provided in hospital at time of discharge  R. Arcola Jansky, PA-C Orthopedic Surgery [facility] [place]

## 2023-01-15 NOTE — Progress Notes (Signed)
COVID Vaccine received:  []  No [x]  Yes Date of any COVID positive Test in last 90 days: no PCP - Dennis Bast MD Cardiologist - Dr. Vernona Rieger  Chest x-ray -  EKG -  01/16/23 Epic Stress Test -  ECHO - 01/21/23 To be done @[facility] [place] [facility] -   Bowel Prep - [x]  No  []   Yes ______  Pacemaker / ICD device [x]  No []  Yes   Spinal Cord Stimulator:[x]  No []  Yes       History of Sleep Apnea? [x]  No []  Yes   CPAP used?- [x]  No []  Yes    Does the patient monitor blood sugar?          [x]  No []  Yes  []  N/A  Patient has: [x]  NO Hx DM   []  Pre-DM                 no DM1  []   DM2 Does patient have a [employer] or Dexacom? []  No []  Yes   Fasting Blood Sugar Ranges-  Checks Blood Sugar _____ times a day  GLP1 agonist / usual dose - no GLP1 instructions:  SGLT-2 inhibitors / usual dose - no SGLT-2 instructions:   Blood Thinner / Instructions:no Aspirin Instructions:no  Comments:   Activity level: Patient is able to climb a flight of stairs without difficulty; [x]  No CP  [x]  No SOB,  ___   Patient canperform ADLs without assistance.   Anesthesia review: HTN, Mitral and aortic regurg.  Patient denies shortness of breath, fever, cough and chest pain at PAT appointment.  Patient verbalized understanding and agreement to the Pre-Surgical Instructions that were given to them at this PAT appointment. Patient was also educated of the need to review these PAT instructions again prior to his/her surgery.I reviewed the appropriate phone numbers to call if they have any and questions or concerns.

## 2023-01-15 NOTE — Patient Instructions (Signed)
SURGICAL WAITING ROOM VISITATION  Patients having surgery or a procedure may have no more than 2 support people in the waiting area - these visitors may rotate.    Children under the age of 46 must have an adult with them who is not the patient.  Due to an increase in RSV and influenza rates and associated hospitalizations, children ages 22 and under may not visit patients in [facility] hospitals.  If the patient needs to stay at the hospital during part of their recovery, the visitor guidelines for inpatient rooms apply. Pre-op nurse will coordinate an appropriate time for 1 support person to accompany patient in pre-op.  This support person may not rotate.    Please refer to the [facility] website for the visitor guidelines for Inpatients (after your surgery is over and you are in a regular room).       Your procedure is scheduled on: 01/23/23   Report to [facility] Main Entrance    Report to admitting at 7:30 AM   Call this number if you have problems the morning of surgery [phone]   Do not eat food :After Midnight.   After Midnight you may have the following liquids until 7AM DAY OF SURGERY  Water Non-Citrus Juices (without pulp, NO RED-Apple, White grape, White cranberry) Black Coffee (NO MILK/CREAM OR CREAMERS, sugar ok)  Clear Tea (NO MILK/CREAM OR CREAMERS, sugar ok) regular and decaf                             Plain Jell-O (NO RED)                                           Fruit ices (not with fruit pulp, NO RED)                                     Popsicles (NO RED)                                                               Sports drinks like Gatorade (NO RED)                  The day of surgery:  Drink ONE (1) Pre-Surgery Clear Ensure at 7 AM the morning of surgery. Drink in one sitting. Do not sip.  This drink was given to you during your hospital  pre-op appointment visit. Nothing else to drink after completing the  Pre-Surgery Clear Ensure       Oral Hygiene is also important to reduce your risk of infection.                                    Remember - BRUSH YOUR TEETH THE MORNING OF SURGERY WITH YOUR REGULAR TOOTHPASTE  DENTURES WILL BE REMOVED PRIOR TO SURGERY PLEASE DO NOT APPLY "Poly grip" OR ADHESIVES!!!   Stop all vitamins and herbal supplements 7 days before surgery.   Take these medicines the  morning of surgery with A SIP OF WATER: Bentyl, omeprazole, paroxetine, rosuvastatin,thyroid armour             You may not have any metal on your body including hair pins, jewelry, and body piercing             Do not wear make-up, lotions, powders, perfumes/cologne, or deodorant  Do not wear nail polish including gel and S&S, artificial/acrylic nails, or any other type of covering on natural nails including finger and toenails. If you have artificial nails, gel coating, etc. that needs to be removed by a nail salon please have this removed prior to surgery or surgery may need to be canceled/ delayed if the surgeon/ anesthesia feels like they are unable to be safely monitored.   Do not shave  48 hours prior to surgery.    Do not bring valuables to the hospital. [facility] IS NOT             RESPONSIBLE   FOR VALUABLES.   Contacts, glasses, dentures or bridgework may not be worn into surgery.   Bring small overnight bag day of surgery.   DO NOT BRING YOUR HOME MEDICATIONS TO THE HOSPITAL. PHARMACY WILL DISPENSE MEDICATIONS LISTED ON YOUR MEDICATION LIST TO YOU DURING YOUR ADMISSION IN THE HOSPITAL!    Patients discharged on the day of surgery will not be allowed to drive home.  Someone NEEDS to stay with you for the first 24 hours after anesthesia.   Special Instructions: Bring a copy of your healthcare power of attorney and living will documents the day of surgery if you haven't scanned them before.              Please read over the following fact sheets you were given: IF YOU HAVE QUESTIONS ABOUT YOUR PRE-OP  INSTRUCTIONS PLEASE CALL [phone] Rosey [facility]   If you received a COVID test during your pre-op visit  it is requested that you wear a mask when out in public, stay away from anyone that may not be feeling well and notify your surgeon if you develop symptoms. If you test positive for Covid or have been in contact with anyone that has tested positive in the last 10 days please notify you surgeon.     - Preparing for Surgery Before surgery, you can play an important role.  Because skin is not sterile, your skin needs to be as free of germs as possible.  You can reduce the number of germs on your skin by washing with CHG (chlorahexidine gluconate) soap before surgery.  CHG is an antiseptic cleaner which kills germs and bonds with the skin to continue killing germs even after washing. Please DO NOT use if you have an allergy to CHG or antibacterial soaps.  If your skin becomes reddened/irritated stop using the CHG and inform your nurse when you arrive at Short Stay. Do not shave (including legs and underarms) for at least 48 hours prior to the first CHG shower.  You may shave your face/neck.  Please follow these instructions carefully:  1.  Shower with CHG Soap the night before surgery and the  morning of surgery.  2.  If you choose to wash your hair, wash your hair first as usual with your normal  shampoo.  3.  After you shampoo, rinse your hair and body thoroughly to remove the shampoo.  4.  Use CHG as you would any other liquid soap.  You can apply chg directly to the skin and wash.  Gently with a scrungie or clean washcloth.  5.  Apply the CHG Soap to your body ONLY FROM THE NECK DOWN.   Do   not use on face/ open                           Wound or open sores. Avoid contact with eyes, ears mouth and   genitals (private parts).                       Wash face,  Genitals (private parts) with your normal soap.             6.  Wash thoroughly, paying special  attention to the area where your    surgery  will be performed.  7.  Thoroughly rinse your body with warm water from the neck down.  8.  DO NOT shower/wash with your normal soap after using and rinsing off the CHG Soap.                9.  Pat yourself dry with a clean towel.            10.  Wear clean pajamas.            11.  Place clean sheets on your bed the night of your first shower and do not  sleep with pets. Day of Surgery : Do not apply any lotions/deodorants the morning of surgery.  Please wear clean clothes to the hospital/surgery center.  FAILURE TO FOLLOW THESE INSTRUCTIONS MAY RESULT IN THE CANCELLATION OF YOUR SURGERY  PATIENT SIGNATURE_________________________________  NURSE SIGNATURE__________________________________  ________________________________________________________________________Incentive Gypsy [employer] (Watch this video at home: [email or web address])  An incentive spirometer is a tool that can help keep your lungs clear and active. This tool measures how well you are filling your lungs with each breath. Taking long deep breaths may help reverse or decrease the chance of developing breathing (pulmonary) problems (especially infection) following: A long period of time when you are unable to move or be active. BEFORE THE PROCEDURE  If the spirometer includes an indicator to show your best effort, your nurse or respiratory therapist will set it to a desired goal. If possible, sit up straight or lean slightly forward. Try not to slouch. Hold the incentive spirometer in an upright position. INSTRUCTIONS FOR USE  Sit on the edge of your bed if possible, or sit up as far as you can in bed or on a chair. Hold the incentive spirometer in an upright position. Breathe out normally. Place the mouthpiece in your mouth and seal your lips tightly around it. Breathe in slowly and as deeply as possible, raising the piston or the ball toward the top of the  column. Hold your breath for 3-5 seconds or for as long as possible. Allow the piston or ball to fall to the bottom of the column. Remove the mouthpiece from your mouth and breathe out normally. Rest for a few seconds and repeat Steps 1 through 7 at least 10 times every 1-2 hours when you are awake. Take your time and take a few normal breaths between deep breaths. The spirometer may include an indicator to show your best effort. Use the indicator as a goal to work toward during each repetition. After each set of 10 deep breaths, practice coughing  to be sure your lungs are clear. If you have an incision (the cut made at the time of surgery), support your incision when coughing by placing a pillow or rolled up towels firmly against it. Once you are able to get out of bed, walk around indoors and cough well. You may stop using the incentive spirometer when instructed by your caregiver.  RISKS AND COMPLICATIONS Take your time so you do not get dizzy or light-headed. If you are in pain, you may need to take or ask for pain medication before doing incentive spirometry. It is harder to take a deep breath if you are having pain. AFTER USE Rest and breathe slowly and easily. It can be helpful to keep track of a log of your progress. Your caregiver can provide you with a simple table to help with this. If you are using the spirometer at home, follow these instructions: SEEK MEDICAL CARE IF:  You are having difficultly using the spirometer. You have trouble using the spirometer as often as instructed. Your pain medication is not giving enough relief while using the spirometer. You develop fever of 100.5 F (38.1 C) or higher. SEEK IMMEDIATE MEDICAL CARE IF:  You cough up bloody sputum that had not been present before. You develop fever of 102 F (38.9 C) or greater. You develop worsening pain at or near the incision site. MAKE SURE YOU:  Understand these instructions. Will watch your  condition. Will get help right away if you are not doing well or get worse. Document Released: 07/16/2006 Document Revised: 05/28/2011 Document Reviewed: 09/16/2006 [facility] Patient Information 2014 [place], [place].

## 2023-01-16 ENCOUNTER — Encounter (HOSPITAL_COMMUNITY)
Admission: RE | Admit: 2023-01-16 | Discharge: 2023-01-16 | Disposition: A | Discharge status: Home / Self Care | Payer: Medicare HMO | Source: Ambulatory Visit | Attending: Orthopedic Surgery | Admitting: Orthopedic Surgery

## 2023-01-16 ENCOUNTER — Other Ambulatory Visit: Payer: Self-pay

## 2023-01-16 VITALS — BP 119/53 | HR 74 | Temp 98.1°F | Resp 16 | Ht 62.5 in | Wt 124.0 lb

## 2023-01-16 DIAGNOSIS — I10 Essential (primary) hypertension: Secondary | ICD-10-CM | POA: Insufficient documentation

## 2023-01-16 DIAGNOSIS — Z01810 Encounter for preprocedural cardiovascular examination: Secondary | ICD-10-CM | POA: Diagnosis present

## 2023-01-22 NOTE — Progress Notes (Signed)
DISCUSSION: Tracey Kramer is a 77 yo female who presents to PAT prior to Left knee tibial versus total knee arthroplasty revision on 01/23/23 with Dr. Lequita Halt. PMH of heart murmur (AI, MR, TR, PR), palpitations, hypertension, hyperlipidemia, carotid artery stenosis, GERD, hypothyroidism.  Prior anesthesia complications include PONV.  Patient follows with cardiology for history of mild heart valve disease, hypertension and bilateral carotid artery stenosis.  She was last seen on 12/05/2022.  She has been asymptomatic.  A repeat echo was obtained on 01/21/2023 which showed mild to moderate AI but otherwise no significant change.  Patient follows with her PCP for chronic conditions.  Today for preop clearance on 12/27/2022. Per PCP: "Pre-op evaluation She walks 30 minutes every day, Vacuum floors with no CP or SOB. She goes shopping and works on her yard with no CP or SOB. No cough, fever or chills. No Presyncope or syncope. She has a Development worker, international aid and is waiting on TTE for f/u on her Aortic valve regurg. From Internal Medicine point of view she is stable for surgery if labs are OK. She is using Semaglutide to lose weight, so I advised her to stop it 10-14 days prior. "   VS: BP (!) 119/53   Pulse 74   Temp 36.7 C (Oral)   Resp 16   Ht 5' 2.5" (1.588 m)   Wt 56.2 kg   SpO2 96%   BMI 22.32 kg/m   PROVIDERS: Andreas Blower., MD ([facility]) Cardiology: Vernona Rieger, MD ([facility])  LABS: Labs reviewed: Acceptable for surgery. (all labs ordered are listed, but only abnormal results are displayed)  Labs Reviewed - No data to display   IMAGES:   EKG 01/16/23:  NSR, rate 68   CV:  Echo 01/21/23:  SUMMARY  Left ventricular systolic function is normal.  LV ejection fraction = 65-70%.  There is aortic valve sclerosis.  There is no aortic stenosis.  There is mild to moderate aortic regurgitation.  Probably no significant change in comparison with the prior study noted   Carotid US  12/06/22:  Conclusion  Right 20-39% diameter reduction of the internal carotid artery. All Doppler velocities within normal limits. No evidence of hemodynamically significant internal carotid artery stenosis. The vertebral artery flow is antegrade.  Left  20-39% diameter reduction of the internal carotid artery. All Doppler velocities within normal limits. No evidence of hemodynamically significant internal carotid artery stenosis. The vertebral artery flow is antegrade.  Past Medical History:  Diagnosis Date   Acid reflux occasional   Acute lateral meniscal tear LEFT KNEE   Anxiety    AR (aortic regurgitation) 11/06/2017   Moderate, noted on ECHO   Arthritis    Constipation    Depression    Heart murmur    Heart palpitations    HOH (hard of hearing)    Hyperlipidemia    Hypertension    Hypothyroidism    IBS (irritable bowel syndrome)    MR (mitral regurgitation) 11/06/2017   Mild, noted on ECHO   PONV (postoperative nausea and vomiting)    PR (pulmonary regurgitation) 11/11/2017   Mild, noted on ECHO   Thyroid disease    hypothyroidism    TR (tricuspid regurgitation) 11/11/2017   Mild, noted on ECHO    Past Surgical History:  Procedure Laterality Date   CHOLECYSTECTOMY  2002   COLONOSCOPY  2012   DILATION AND CURETTAGE OF UTERUS  1974   KNEE ARTHROSCOPY  06/27/2011   Procedure: ARTHROSCOPY KNEE;  Surgeon: Loanne Drilling, MD;  Location: [facility];  Service: Orthopedics;  Laterality: Left;  LEFT KNEE SCOPE WITH lateral meniscal DEBRIDEMENT     TONSILLECTOMY AND ADENOIDECTOMY  AS CHILD   TOTAL KNEE ARTHROPLASTY Left 01/13/2018   Procedure: LEFT TOTAL KNEE ARTHROPLASTY;  Surgeon: Ollen Gross, MD;  Location: [facility];  Service: Orthopedics;  Laterality: Left;   VAGINAL HYSTERECTOMY  1976    MEDICATIONS:  dicyclomine (BENTYL) 20 MG tablet   doxycycline (VIBRA-TABS) 100 MG tablet   Magnesium 500 MG CAPS   mometasone (ELOCON) 0.1 % cream   Omega 3-6-9  Fatty Acids (OMEGA DHA PO)   omeprazole (PRILOSEC) 40 MG capsule   PARoxetine (PAXIL) 20 MG tablet   polyethylene glycol (MIRALAX / GLYCOLAX) packet   rosuvastatin (CRESTOR) 20 MG tablet   thyroid (ARMOUR) 90 MG tablet   traZODone (DESYREL) 100 MG tablet   triamterene-hydrochlorothiazide (DYAZIDE) 37.5-25 MG capsule   TURMERIC CURCUMIN PO   No current facility-administered medications for this encounter.   Marcille Blanco [facility] Surgical Short Stay/Anesthesiology [facility] Phone [phone] 01/22/2023 10:14 AM

## 2023-01-22 NOTE — Anesthesia Preprocedure Evaluation (Signed)
Anesthesia Evaluation  Patient identified by MRN, date of birth, ID band Patient awake    Reviewed: Allergy & Precautions, NPO status , Patient's Chart, lab work & pertinent test results  History of Anesthesia Complications (+) PONV and history of anesthetic complications  Airway Mallampati: II  TM Distance: >3 FB Neck ROM: Full    Dental  (+) Dental Advisory Given, Teeth Intact   Pulmonary neg pulmonary ROS   Pulmonary exam normal        Cardiovascular hypertension, Pt. on medications Normal cardiovascular exam+ Valvular Problems/Murmurs AI    '24 TTE - EF 65-70%. There is mild to moderate aortic regurgitation.  Previous echo's also noted mild MR, TR, and PR    Neuro/Psych  PSYCHIATRIC DISORDERS Anxiety Depression    negative neurological ROS     GI/Hepatic Neg liver ROS, PUD,GERD  Medicated and Controlled,,  Endo/Other  Hypothyroidism    Renal/GU negative Renal ROS     Musculoskeletal  (+) Arthritis ,    Abdominal   Peds  Hematology negative hematology ROS (+)   Anesthesia Other Findings   Reproductive/Obstetrics                             Anesthesia Physical Anesthesia Plan  ASA: 2  Anesthesia Plan: Spinal   Post-op Pain Management: Regional block* and Tylenol PO (pre-op)*   Induction:   PONV Risk Score and Plan: 3 and Treatment may vary due to age or medical condition and Propofol infusion  Airway Management Planned: Natural Airway and Simple Face Mask  Additional Equipment: None  Intra-op Plan:   Post-operative Plan:   Informed Consent: I have reviewed the patients History and Physical, chart, labs and discussed the procedure including the risks, benefits and alternatives for the proposed anesthesia with the patient or authorized representative who has indicated his/her understanding and acceptance.       Plan Discussed with: CRNA and  Anesthesiologist  Anesthesia Plan Comments: (Labs reviewed, platelets acceptable. Discussed risks and benefits of spinal, including spinal/epidural hematoma, infection, failed block, and PDPH. Patient expressed understanding and wished to proceed. See PAT note)        Anesthesia Quick Evaluation

## 2023-01-23 ENCOUNTER — Encounter (HOSPITAL_COMMUNITY)
Admission: RE | Disposition: A | Discharge status: Home / Self Care | Payer: Self-pay | Source: Ambulatory Visit | Attending: Orthopedic Surgery

## 2023-01-23 ENCOUNTER — Other Ambulatory Visit: Payer: Self-pay

## 2023-01-23 ENCOUNTER — Inpatient Hospital Stay (HOSPITAL_COMMUNITY)
Admission: RE | Admit: 2023-01-23 | Discharge: 2023-01-25 | DRG: 468 | Disposition: A | Discharge status: Home / Self Care | Payer: Medicare HMO | Source: Ambulatory Visit | Attending: Orthopedic Surgery | Admitting: Orthopedic Surgery

## 2023-01-23 ENCOUNTER — Inpatient Hospital Stay (HOSPITAL_COMMUNITY): Payer: Medicare HMO | Admitting: Medical

## 2023-01-23 ENCOUNTER — Inpatient Hospital Stay (HOSPITAL_COMMUNITY): Payer: Medicare HMO

## 2023-01-23 ENCOUNTER — Encounter (HOSPITAL_COMMUNITY): Payer: Self-pay | Admitting: Orthopedic Surgery

## 2023-01-23 DIAGNOSIS — T84033A Mechanical loosening of internal left knee prosthetic joint, initial encounter: Secondary | ICD-10-CM | POA: Diagnosis present

## 2023-01-23 DIAGNOSIS — Z880 Allergy status to penicillin: Secondary | ICD-10-CM | POA: Diagnosis not present

## 2023-01-23 DIAGNOSIS — Z9049 Acquired absence of other specified parts of digestive tract: Secondary | ICD-10-CM | POA: Diagnosis not present

## 2023-01-23 DIAGNOSIS — E039 Hypothyroidism, unspecified: Secondary | ICD-10-CM | POA: Diagnosis present

## 2023-01-23 DIAGNOSIS — Z90710 Acquired absence of both cervix and uterus: Secondary | ICD-10-CM

## 2023-01-23 DIAGNOSIS — H9190 Unspecified hearing loss, unspecified ear: Secondary | ICD-10-CM | POA: Diagnosis present

## 2023-01-23 DIAGNOSIS — Z79899 Other long term (current) drug therapy: Secondary | ICD-10-CM | POA: Diagnosis not present

## 2023-01-23 DIAGNOSIS — T84093A Other mechanical complication of internal left knee prosthesis, initial encounter: Secondary | ICD-10-CM

## 2023-01-23 DIAGNOSIS — Y792 Prosthetic and other implants, materials and accessory orthopedic devices associated with adverse incidents: Secondary | ICD-10-CM | POA: Diagnosis present

## 2023-01-23 DIAGNOSIS — E785 Hyperlipidemia, unspecified: Secondary | ICD-10-CM | POA: Diagnosis present

## 2023-01-23 DIAGNOSIS — K219 Gastro-esophageal reflux disease without esophagitis: Secondary | ICD-10-CM | POA: Diagnosis present

## 2023-01-23 DIAGNOSIS — F32A Depression, unspecified: Secondary | ICD-10-CM | POA: Diagnosis present

## 2023-01-23 DIAGNOSIS — F419 Anxiety disorder, unspecified: Secondary | ICD-10-CM | POA: Diagnosis present

## 2023-01-23 DIAGNOSIS — I10 Essential (primary) hypertension: Secondary | ICD-10-CM | POA: Diagnosis present

## 2023-01-23 DIAGNOSIS — Z96659 Presence of unspecified artificial knee joint: Principal | ICD-10-CM

## 2023-01-23 DIAGNOSIS — Z79890 Hormone replacement therapy (postmenopausal): Secondary | ICD-10-CM

## 2023-01-23 DIAGNOSIS — Z7982 Long term (current) use of aspirin: Secondary | ICD-10-CM

## 2023-01-23 HISTORY — DX: Anemia, unspecified: D64.9

## 2023-01-23 HISTORY — PX: TOTAL KNEE REVISION: SHX996

## 2023-01-23 SURGERY — TOTAL KNEE REVISION
Anesthesia: Spinal | Site: Knee | Laterality: Left

## 2023-01-23 MED ORDER — OXYCODONE HCL 5 MG PO TABS
5.0000 mg | ORAL_TABLET | ORAL | Status: DC | PRN
Start: 2023-01-23 — End: 2023-01-25
  Administered 2023-01-23 – 2023-01-25 (×2): 10 mg via ORAL
  Filled 2023-01-23 (×2): qty 2

## 2023-01-23 MED ORDER — POLYETHYLENE GLYCOL 3350 17 G PO PACK: 17.0000 g | PACK | Freq: Every day | ORAL | Status: DC | PRN

## 2023-01-23 MED ORDER — PROPOFOL 500 MG/50ML IV EMUL
INTRAVENOUS | Status: DC | PRN
  Administered 2023-01-23: 50 ug/kg/min via INTRAVENOUS

## 2023-01-23 MED ORDER — ACETAMINOPHEN 10 MG/ML IV SOLN
INTRAVENOUS | Status: AC
  Filled 2023-01-23: qty 100

## 2023-01-23 MED ORDER — PROPOFOL 1000 MG/100ML IV EMUL
INTRAVENOUS | Status: AC
  Filled 2023-01-23: qty 100

## 2023-01-23 MED ORDER — OXYCODONE HCL 5 MG PO TABS
10.0000 mg | ORAL_TABLET | ORAL | Status: DC | PRN
  Administered 2023-01-23: 10 mg via ORAL
  Administered 2023-01-24 (×2): 15 mg via ORAL
  Administered 2023-01-24 – 2023-01-25 (×3): 10 mg via ORAL
  Filled 2023-01-23 (×3): qty 2
  Filled 2023-01-23: qty 3
  Filled 2023-01-23: qty 2
  Filled 2023-01-23: qty 3

## 2023-01-23 MED ORDER — ORAL CARE MOUTH RINSE: 15.0000 mL | Freq: Once | OROMUCOSAL | Status: AC

## 2023-01-23 MED ORDER — CEFAZOLIN SODIUM-DEXTROSE 2-4 GM/100ML-% IV SOLN
2.0000 g | Freq: Four times a day (QID) | INTRAVENOUS | Status: AC
  Administered 2023-01-23 – 2023-01-24 (×2): 2 g via INTRAVENOUS
  Filled 2023-01-23 (×2): qty 100

## 2023-01-23 MED ORDER — BUPIVACAINE LIPOSOME 1.3 % IJ SUSP
INTRAMUSCULAR | Status: DC | PRN
  Administered 2023-01-23: 20 mL

## 2023-01-23 MED ORDER — TRIAMTERENE-HCTZ 37.5-25 MG PO TABS
1.0000 | ORAL_TABLET | Freq: Every day | ORAL | Status: DC
  Administered 2023-01-25: 1 via ORAL
  Filled 2023-01-23 (×2): qty 1

## 2023-01-23 MED ORDER — MIDAZOLAM HCL 2 MG/2ML IJ SOLN: 1.0000 mg | Freq: Once | INTRAMUSCULAR | Status: DC

## 2023-01-23 MED ORDER — OXYCODONE HCL 5 MG PO TABS
ORAL_TABLET | ORAL | Status: AC
  Filled 2023-01-23: qty 1

## 2023-01-23 MED ORDER — DEXAMETHASONE SODIUM PHOSPHATE 10 MG/ML IJ SOLN
8.0000 mg | Freq: Once | INTRAMUSCULAR | Status: AC
  Administered 2023-01-23: 8 mg via INTRAVENOUS

## 2023-01-23 MED ORDER — FENTANYL CITRATE PF 50 MCG/ML IJ SOSY
50.0000 ug | PREFILLED_SYRINGE | Freq: Once | INTRAMUSCULAR | Status: AC
Start: 2023-01-23 — End: 2023-01-23
  Administered 2023-01-23: 50 ug via INTRAVENOUS
  Filled 2023-01-23: qty 1

## 2023-01-23 MED ORDER — OXYCODONE HCL 5 MG PO TABS
5.0000 mg | ORAL_TABLET | Freq: Once | ORAL | Status: AC | PRN
Start: 2023-01-23 — End: 2023-01-23
  Administered 2023-01-23: 5 mg via ORAL

## 2023-01-23 MED ORDER — CEFAZOLIN SODIUM-DEXTROSE 2-4 GM/100ML-% IV SOLN
INTRAVENOUS | Status: AC
  Filled 2023-01-23: qty 100

## 2023-01-23 MED ORDER — DEXAMETHASONE SODIUM PHOSPHATE 10 MG/ML IJ SOLN
10.0000 mg | Freq: Once | INTRAMUSCULAR | Status: AC
  Administered 2023-01-24: 10 mg via INTRAVENOUS
  Filled 2023-01-23: qty 1

## 2023-01-23 MED ORDER — METHOCARBAMOL 1000 MG/10ML IJ SOLN
500.0000 mg | Freq: Four times a day (QID) | INTRAMUSCULAR | Status: DC | PRN
  Administered 2023-01-23: 500 mg via INTRAVENOUS

## 2023-01-23 MED ORDER — TRANEXAMIC ACID-NACL 1000-0.7 MG/100ML-% IV SOLN
1000.0000 mg | INTRAVENOUS | Status: AC
  Administered 2023-01-23: 1000 mg via INTRAVENOUS

## 2023-01-23 MED ORDER — ONDANSETRON HCL 4 MG/2ML IJ SOLN
INTRAMUSCULAR | Status: DC | PRN
  Administered 2023-01-23: 4 mg via INTRAVENOUS

## 2023-01-23 MED ORDER — FENTANYL CITRATE PF 50 MCG/ML IJ SOSY
PREFILLED_SYRINGE | INTRAMUSCULAR | Status: AC
  Filled 2023-01-23: qty 1

## 2023-01-23 MED ORDER — PAROXETINE HCL 10 MG PO TABS
20.0000 mg | ORAL_TABLET | Freq: Every day | ORAL | Status: DC
  Administered 2023-01-24 – 2023-01-25 (×2): 20 mg via ORAL
  Filled 2023-01-23 (×2): qty 2

## 2023-01-23 MED ORDER — SODIUM CHLORIDE (PF) 0.9 % IJ SOLN
INTRAMUSCULAR | Status: AC
Start: 2023-01-23 — End: ?
  Filled 2023-01-23: qty 50

## 2023-01-23 MED ORDER — DICYCLOMINE HCL 20 MG PO TABS: 20.0000 mg | ORAL_TABLET | Freq: Three times a day (TID) | ORAL | Status: DC | PRN

## 2023-01-23 MED ORDER — ROSUVASTATIN CALCIUM 10 MG PO TABS
10.0000 mg | ORAL_TABLET | Freq: Every evening | ORAL | Status: DC
  Administered 2023-01-24: 10 mg via ORAL
  Filled 2023-01-23: qty 1

## 2023-01-23 MED ORDER — FENTANYL CITRATE PF 50 MCG/ML IJ SOSY
25.0000 ug | PREFILLED_SYRINGE | INTRAMUSCULAR | Status: DC | PRN
  Administered 2023-01-23: 50 ug via INTRAVENOUS

## 2023-01-23 MED ORDER — ONDANSETRON HCL 4 MG/2ML IJ SOLN
INTRAMUSCULAR | Status: AC
  Filled 2023-01-23: qty 2

## 2023-01-23 MED ORDER — SODIUM CHLORIDE (PF) 0.9 % IJ SOLN
INTRAMUSCULAR | Status: DC | PRN
  Administered 2023-01-23: 60 mL

## 2023-01-23 MED ORDER — MENTHOL 3 MG MT LOZG: 1.0000 | LOZENGE | OROMUCOSAL | Status: DC | PRN

## 2023-01-23 MED ORDER — OXYCODONE HCL 5 MG/5ML PO SOLN: 5.0000 mg | Freq: Once | ORAL | Status: AC | PRN

## 2023-01-23 MED ORDER — ONDANSETRON HCL 4 MG PO TABS: 4.0000 mg | ORAL_TABLET | Freq: Four times a day (QID) | ORAL | Status: DC | PRN

## 2023-01-23 MED ORDER — BISACODYL 10 MG RE SUPP: 10.0000 mg | Freq: Every day | RECTAL | Status: DC | PRN

## 2023-01-23 MED ORDER — DIPHENHYDRAMINE HCL 12.5 MG/5ML PO ELIX: 12.5000 mg | ORAL_SOLUTION | ORAL | Status: DC | PRN

## 2023-01-23 MED ORDER — TRIAMTERENE-HCTZ 37.5-25 MG PO CAPS: 1.0000 | ORAL_CAPSULE | Freq: Every morning | ORAL | Status: DC

## 2023-01-23 MED ORDER — PROPOFOL 10 MG/ML IV BOLUS
INTRAVENOUS | Status: AC
Start: 2023-01-23 — End: ?
  Filled 2023-01-23: qty 20

## 2023-01-23 MED ORDER — ACETAMINOPHEN 500 MG PO TABS
1000.0000 mg | ORAL_TABLET | Freq: Four times a day (QID) | ORAL | Status: AC
  Administered 2023-01-23 – 2023-01-24 (×4): 1000 mg via ORAL
  Filled 2023-01-23 (×4): qty 2

## 2023-01-23 MED ORDER — PHENYLEPHRINE HCL-NACL 20-0.9 MG/250ML-% IV SOLN
INTRAVENOUS | Status: DC | PRN
  Administered 2023-01-23: 45 ug/min via INTRAVENOUS

## 2023-01-23 MED ORDER — ACETAMINOPHEN 10 MG/ML IV SOLN
1000.0000 mg | Freq: Four times a day (QID) | INTRAVENOUS | Status: DC
  Administered 2023-01-23: 1000 mg via INTRAVENOUS

## 2023-01-23 MED ORDER — PHENOL 1.4 % MT LIQD: 1.0000 | OROMUCOSAL | Status: DC | PRN

## 2023-01-23 MED ORDER — TRANEXAMIC ACID-NACL 1000-0.7 MG/100ML-% IV SOLN
INTRAVENOUS | Status: AC
  Filled 2023-01-23: qty 100

## 2023-01-23 MED ORDER — HYDROMORPHONE HCL 1 MG/ML IJ SOLN
0.5000 mg | INTRAMUSCULAR | Status: DC | PRN
Start: 2023-01-23 — End: 2023-01-25
  Administered 2023-01-25: 1 mg via INTRAVENOUS
  Filled 2023-01-23: qty 1

## 2023-01-23 MED ORDER — TRAZODONE HCL 50 MG PO TABS
50.0000 mg | ORAL_TABLET | Freq: Every day | ORAL | Status: DC
  Administered 2023-01-23 – 2023-01-24 (×2): 50 mg via ORAL
  Filled 2023-01-23 (×2): qty 1

## 2023-01-23 MED ORDER — DOCUSATE SODIUM 100 MG PO CAPS
100.0000 mg | ORAL_CAPSULE | Freq: Two times a day (BID) | ORAL | Status: DC
  Administered 2023-01-23 – 2023-01-25 (×4): 100 mg via ORAL
  Filled 2023-01-23 (×4): qty 1

## 2023-01-23 MED ORDER — FLEET ENEMA RE ENEM: 1.0000 | ENEMA | Freq: Once | RECTAL | Status: DC | PRN

## 2023-01-23 MED ORDER — SODIUM CHLORIDE (PF) 0.9 % IJ SOLN
INTRAMUSCULAR | Status: AC
  Filled 2023-01-23: qty 10

## 2023-01-23 MED ORDER — POVIDONE-IODINE 10 % EX SWAB
2.0000 | Freq: Once | CUTANEOUS | Status: AC
  Administered 2023-01-23: 2 via TOPICAL

## 2023-01-23 MED ORDER — DEXAMETHASONE SODIUM PHOSPHATE 10 MG/ML IJ SOLN
INTRAMUSCULAR | Status: AC
  Filled 2023-01-23: qty 1

## 2023-01-23 MED ORDER — ACETAMINOPHEN 500 MG PO TABS: 1000.0000 mg | ORAL_TABLET | Freq: Once | ORAL | Status: DC

## 2023-01-23 MED ORDER — ASPIRIN 81 MG PO CHEW
81.0000 mg | CHEWABLE_TABLET | Freq: Two times a day (BID) | ORAL | Status: DC
  Administered 2023-01-24 – 2023-01-25 (×3): 81 mg via ORAL
  Filled 2023-01-23 (×3): qty 1

## 2023-01-23 MED ORDER — METHOCARBAMOL 1000 MG/10ML IJ SOLN
INTRAMUSCULAR | Status: AC
  Filled 2023-01-23: qty 10

## 2023-01-23 MED ORDER — ROPIVACAINE HCL 7.5 MG/ML IJ SOLN
INTRAMUSCULAR | Status: DC | PRN
  Administered 2023-01-23: 20 mL via PERINEURAL

## 2023-01-23 MED ORDER — METOCLOPRAMIDE HCL 5 MG PO TABS: 5.0000 mg | ORAL_TABLET | Freq: Three times a day (TID) | ORAL | Status: DC | PRN

## 2023-01-23 MED ORDER — SODIUM CHLORIDE 0.9 % IV SOLN: INTRAVENOUS | Status: DC

## 2023-01-23 MED ORDER — ORAL CARE MOUTH RINSE: 15.0000 mL | OROMUCOSAL | Status: DC | PRN

## 2023-01-23 MED ORDER — BUPIVACAINE LIPOSOME 1.3 % IJ SUSP
INTRAMUSCULAR | Status: AC
  Filled 2023-01-23: qty 20

## 2023-01-23 MED ORDER — PANTOPRAZOLE SODIUM 40 MG PO TBEC
80.0000 mg | DELAYED_RELEASE_TABLET | Freq: Every day | ORAL | Status: DC
  Administered 2023-01-24 – 2023-01-25 (×2): 80 mg via ORAL
  Filled 2023-01-23 (×2): qty 2

## 2023-01-23 MED ORDER — ALUM & MAG HYDROXIDE-SIMETH 200-200-20 MG/5ML PO SUSP
30.0000 mL | Freq: Four times a day (QID) | ORAL | Status: DC | PRN
  Administered 2023-01-23: 30 mL via ORAL
  Filled 2023-01-23: qty 30

## 2023-01-23 MED ORDER — EPHEDRINE 5 MG/ML INJ
INTRAVENOUS | Status: AC
  Filled 2023-01-23: qty 5

## 2023-01-23 MED ORDER — BUPIVACAINE IN DEXTROSE 0.75-8.25 % IT SOLN
INTRATHECAL | Status: DC | PRN
  Administered 2023-01-23: 1.6 mL via INTRATHECAL

## 2023-01-23 MED ORDER — PANTOPRAZOLE SODIUM 40 MG PO TBEC
80.0000 mg | DELAYED_RELEASE_TABLET | Freq: Once | ORAL | Status: DC
  Filled 2023-01-23: qty 2

## 2023-01-23 MED ORDER — BUPIVACAINE LIPOSOME 1.3 % IJ SUSP: 20.0000 mL | Freq: Once | INTRAMUSCULAR | Status: DC

## 2023-01-23 MED ORDER — ONDANSETRON HCL 4 MG/2ML IJ SOLN: 4.0000 mg | Freq: Once | INTRAMUSCULAR | Status: DC | PRN

## 2023-01-23 MED ORDER — METHOCARBAMOL 500 MG PO TABS
500.0000 mg | ORAL_TABLET | Freq: Four times a day (QID) | ORAL | Status: DC | PRN
  Administered 2023-01-25: 500 mg via ORAL
  Filled 2023-01-23: qty 1

## 2023-01-23 MED ORDER — CHLORHEXIDINE GLUCONATE 0.12 % MT SOLN
15.0000 mL | Freq: Once | OROMUCOSAL | Status: AC
  Administered 2023-01-23: 15 mL via OROMUCOSAL

## 2023-01-23 MED ORDER — THYROID 60 MG PO TABS
90.0000 mg | ORAL_TABLET | Freq: Every day | ORAL | Status: DC
  Administered 2023-01-24 – 2023-01-25 (×2): 90 mg via ORAL
  Filled 2023-01-23 (×2): qty 1

## 2023-01-23 MED ORDER — METOCLOPRAMIDE HCL 5 MG/ML IJ SOLN: 5.0000 mg | Freq: Three times a day (TID) | INTRAMUSCULAR | Status: DC | PRN

## 2023-01-23 MED ORDER — LACTATED RINGERS IV SOLN: INTRAVENOUS | Status: DC

## 2023-01-23 MED ORDER — ONDANSETRON HCL 4 MG/2ML IJ SOLN: 4.0000 mg | Freq: Four times a day (QID) | INTRAMUSCULAR | Status: DC | PRN

## 2023-01-23 MED ORDER — EPHEDRINE SULFATE-NACL 50-0.9 MG/10ML-% IV SOSY
PREFILLED_SYRINGE | INTRAVENOUS | Status: DC | PRN
  Administered 2023-01-23: 5 mg via INTRAVENOUS

## 2023-01-23 MED ORDER — CEFAZOLIN SODIUM-DEXTROSE 2-4 GM/100ML-% IV SOLN
2.0000 g | INTRAVENOUS | Status: AC
  Administered 2023-01-23: 2 g via INTRAVENOUS

## 2023-01-23 MED ORDER — PROPOFOL 10 MG/ML IV BOLUS
INTRAVENOUS | Status: DC | PRN
  Administered 2023-01-23: 20 mg via INTRAVENOUS
  Administered 2023-01-23: 15 mg via INTRAVENOUS
  Administered 2023-01-23: 30 mg via INTRAVENOUS
  Administered 2023-01-23: 35 mg via INTRAVENOUS

## 2023-01-23 SURGICAL SUPPLY — 60 items
AUG TIB .75 UNV 5 REV KN (Joint) ×2 IMPLANT
AUG TIB ATTUNE UNV SZ3 4X5 (Joint) ×2 IMPLANT
AUGMENT TIB ATTUNE UNV SZ3 4X5 (Joint) IMPLANT
BAG COUNTER SPONGE SURGICOUNT (BAG) IMPLANT
BAG SPEC THK2 15X12 ZIP CLS (MISCELLANEOUS)
BAG SPNG CNTER NS LX DISP (BAG)
BAG ZIPLOCK 12X15 (MISCELLANEOUS) IMPLANT
BLADE SAG 18X100X1.27 (BLADE) ×1 IMPLANT
BLADE SAW SGTL 11.0X1.19X90.0M (BLADE) ×1 IMPLANT
BLADE SURG SZ10 CARB STEEL (BLADE) IMPLANT
BNDG CMPR 6 X 5 YARDS HK CLSR (GAUZE/BANDAGES/DRESSINGS) ×1
BNDG ELASTIC 6INX 5YD STR LF (GAUZE/BANDAGES/DRESSINGS) ×1 IMPLANT
BONE CEMENT GENTAMICIN (Cement) ×2 IMPLANT
BSPLAT TIB 3 CMNT REV ROT PLAT (Knees) ×1 IMPLANT
CEMENT BONE GENTAMICIN 40 (Cement) ×3 IMPLANT
COVER SURGICAL LIGHT HANDLE (MISCELLANEOUS) ×1 IMPLANT
CUFF TOURN SGL QUICK 34 (TOURNIQUET CUFF) ×1
CUFF TRNQT CYL 34X4.125X (TOURNIQUET CUFF) ×1 IMPLANT
DRAPE U-SHAPE 47X51 STRL (DRAPES) ×1 IMPLANT
DRSG AQUACEL AG ADV 3.5X10 (GAUZE/BANDAGES/DRESSINGS) ×1 IMPLANT
DURAPREP 26ML APPLICATOR (WOUND CARE) ×1 IMPLANT
ELECT REM PT RETURN 15FT ADLT (MISCELLANEOUS) ×1 IMPLANT
GLOVE BIO SURGEON STRL SZ 6.5 (GLOVE) IMPLANT
GLOVE BIO SURGEON STRL SZ7 (GLOVE) IMPLANT
GLOVE BIO SURGEON STRL SZ8 (GLOVE) ×2 IMPLANT
GLOVE BIOGEL PI IND STRL 7.0 (GLOVE) IMPLANT
GLOVE BIOGEL PI IND STRL 8 (GLOVE) ×1 IMPLANT
GOWN STRL REUS W/ TWL LRG LVL3 (GOWN DISPOSABLE) ×1 IMPLANT
GOWN STRL REUS W/TWL LRG LVL3 (GOWN DISPOSABLE) ×1
HANDPIECE INTERPULSE COAX TIP (DISPOSABLE) ×1
HOLDER FOLEY CATH W/STRAP (MISCELLANEOUS) IMPLANT
IMMOBILIZER KNEE 20 (SOFTGOODS) ×1
IMMOBILIZER KNEE 20 THIGH 36 (SOFTGOODS) ×1 IMPLANT
INSERT KNEE ATTUNE SZ4 14MM (Insert) IMPLANT
INSERT TIB CMT ATTUNE RP SZ3 (Knees) IMPLANT
KIT TURNOVER KIT A (KITS) IMPLANT
MANIFOLD NEPTUNE II (INSTRUMENTS) ×1 IMPLANT
NS IRRIG 1000ML POUR BTL (IV SOLUTION) ×1 IMPLANT
PACK TOTAL KNEE CUSTOM (KITS) ×1 IMPLANT
PADDING CAST COTTON 6X4 STRL (CAST SUPPLIES) ×2 IMPLANT
PIN STEINMAN FIXATION KNEE (PIN) IMPLANT
PROTECTOR NERVE ULNAR (MISCELLANEOUS) ×1 IMPLANT
SET HNDPC FAN SPRY TIP SCT (DISPOSABLE) ×1 IMPLANT
SLEEVE KNEE ATTUNE 29MM (Knees) IMPLANT
SPIKE FLUID TRANSFER (MISCELLANEOUS) IMPLANT
STEM STR ATTUNE PF 14X60 (Knees) IMPLANT
STRIP CLOSURE SKIN 1/2X4 (GAUZE/BANDAGES/DRESSINGS) ×2 IMPLANT
SUT MNCRL AB 4-0 PS2 18 (SUTURE) ×1 IMPLANT
SUT STRATAFIX 0 PDS 27 VIOLET (SUTURE) ×1
SUT VIC AB 2-0 CT1 27 (SUTURE) ×3
SUT VIC AB 2-0 CT1 TAPERPNT 27 (SUTURE) ×3 IMPLANT
SUTURE STRATFX 0 PDS 27 VIOLET (SUTURE) ×1 IMPLANT
SWAB COLLECTION DEVICE MRSA (MISCELLANEOUS) IMPLANT
SWAB CULTURE ESWAB REG 1ML (MISCELLANEOUS) IMPLANT
TOWER CARTRIDGE SMART MIX (DISPOSABLE) ×1 IMPLANT
TRAY FOLEY MTR SLVR 16FR STAT (SET/KITS/TRAYS/PACK) ×1 IMPLANT
TUBE KAMVAC SUCTION (TUBING) IMPLANT
TUBE SUCTION HIGH CAP CLEAR NV (SUCTIONS) ×1 IMPLANT
WATER STERILE IRR 1000ML POUR (IV SOLUTION) ×1 IMPLANT
WRAP KNEE MAXI GEL POST OP (GAUZE/BANDAGES/DRESSINGS) IMPLANT

## 2023-01-23 NOTE — Anesthesia Procedure Notes (Signed)
Anesthesia Regional Block: Adductor canal block   Pre-Anesthetic Checklist: , timeout performed,  Correct Patient, Correct Site, Correct Laterality,  Correct Procedure, Correct Position, site marked,  Risks and benefits discussed,  Surgical consent,  Pre-op evaluation,  At surgeon's request and post-op pain management  Laterality: Left  Prep: chloraprep       Needles:  Injection technique: Single-shot  Needle Type: Echogenic Needle     Needle Length: 10cm  Needle Gauge: 21     Additional Needles:   Narrative:  Start time: 01/23/2023 12:28 PM End time: 01/23/2023 12:31 PM Injection made incrementally with aspirations every 5 mL.  Performed by: Personally  Anesthesiologist: Beryle Lathe, MD  Additional Notes: No pain on injection. No increased resistance to injection. Injection made in 5cc increments. Good needle visualization. Patient tolerated the procedure well.

## 2023-01-23 NOTE — Interval H&P Note (Signed)
History and Physical Interval Note:  01/23/2023 11:32 AM  Tracey Kramer  has presented today for surgery, with the diagnosis of aseptic loosening left total knee arthroplasty.  The various methods of treatment have been discussed with the patient and family. After consideration of risks, benefits and other options for treatment, the patient has consented to  Procedure(s): Left knee tibial versus total knee arthroplasty revision (Left) as a surgical intervention.  The patient's history has been reviewed, patient examined, no change in status, stable for surgery.  I have reviewed the patient's chart and labs.  Questions were answered to the patient's satisfaction.     Homero Fellers [name]

## 2023-01-23 NOTE — Brief Op Note (Signed)
01/23/2023  2:37 PM  PATIENT:  Tracey Kramer  77 y.o. female  PRE-OPERATIVE DIAGNOSIS:  aseptic loosening left total knee arthroplasty  POST-OPERATIVE DIAGNOSIS:  aseptic loosening left total knee arthroplasty  PROCEDURE:  Procedure(s): Left knee tibial knee arthroplasty revision (Left)  SURGEON:  Surgeons and Role:    Ollen Gross, MD - Primary  PHYSICIAN ASSISTANT:   ASSISTANTS: Arther Abbott, PA-C   ANESTHESIA:   regional and spinal  EBL:  25 mL   BLOOD ADMINISTERED:none  DRAINS: none   LOCAL MEDICATIONS USED:  OTHER Exparel  COUNTS:  YES  TOURNIQUET:  34 minutes @ 300 mm HG  DICTATION: .Dragon [id number]  PLAN OF CARE: Admit to inpatient   PATIENT DISPOSITION:  PACU - hemodynamically stable.

## 2023-01-23 NOTE — Anesthesia Procedure Notes (Signed)
Procedure Name: MAC Date/Time: 01/23/2023 1:22 PM  Performed by: Maurene Capes, CRNAPre-anesthesia Checklist: Patient identified, Emergency Drugs available, Suction available, Patient being monitored and Timeout performed Patient Re-evaluated:Patient Re-evaluated prior to induction Oxygen Delivery Method: Nasal cannula Preoxygenation: Pre-oxygenation with 100% oxygen Induction Type: IV induction Placement Confirmation: positive ETCO2 Dental Injury: Teeth and Oropharynx as per pre-operative assessment

## 2023-01-23 NOTE — Op Note (Unsigned)
Tracey Kramer, Tracey Kramer MEDICAL RECORD NO: [id number] ACCOUNT NO: [id number] DATE OF BIRTH: 10-23-1945 FACILITY: [facility] LOCATION: [facility] PHYSICIAN: Gus [name]. 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  PROCEDURE PERFORMED: Left tibial revision.  SURGEON: Gus [name]. Krystalyn Kubota, MD  ASSISTANT: Lyla Glassing, PA-C  ANESTHESIA: Adductor canal block and spinal.  ESTIMATED BLOOD LOSS: 25 mL.  DRAINS: None.  TOURNIQUET TIME: 34 minutes at 300 mmHg.  COMPLICATIONS: None.  CONDITION: Stable to recovery.  BRIEF CLINICAL NOTE: The patient is a 77 year old female who had a left total knee arthroplasty done a few years ago.  She did well initially, then she started to have worsening pain in the pretibial region.  She had an infection workup which was negative. She had a bone  scan which was consistent with tibial loosening.  She presents now for tibial versus total knee arthroplasty revision.  DESCRIPTION OF PROCEDURE: After successful administration of adductor canal block and spinal anesthetic, a tourniquet was placed on her left thigh and her left lower extremity was prepped and draped in the usual sterile fashion.  Extremities were wrapped in an Esmarch, knee  flexed, and tourniquet inflated to 300 mmHg.  Midline incision was made with a #10 blade through the subcutaneous tissue to the level of the extensor mechanism.  Fresh blade was used to make a medial parapatellar arthrotomy.  I did not encounter any  fluid in the joint.  Soft tissue in the proximal medial tibia was subperiosteally elevated to the joint line with a knife and into the semimembranosus bursa with a Cobb elevator.  Soft tissue laterally was elevated with tension being paid to avoid the  patellar tendon on the tibial  tubercle. There was a small amount of scarring underneath the extensor mechanism that was removed.  The patella was then everted and the knee flexed 90 degrees.  The tibia subluxed forward.  I was able to dislocate the polyethylene and remove it.  Circumferential retraction was placed around the tibia and the tibia subluxed forward.  An oscillating saw was used to disrupt the interface between the tibial  component and bone.  With minimal disruption, the tibia came out easily, which was consistent with a loose tibial component.  We then cleared the cement out of the metaphysis and proximal medullary canal.  Extramedullary tibial alignment guide was then  placed referencing proximally at the medial aspect of the tibial tubercle and distally along the second metatarsal axis and tibial crest.  Block was pinned to remove about 2 mm off the cut bone surface.  Tibial resection was made with an oscillating saw.   The canal was then thoroughly irrigated and subsequently reamed up to 14 mm for 14 x 60 stem extension. The size 4 Attune tibia is the most appropriate size for the revision tibia.  Template was placed and a proximal drill was used to start the  proximal hole.  I then prepared for a 29 mm sleeve by using the broach trial.  We then assembled a tibial trial, which was a size 4 Attune revision tibia with 5 mm augments medial and lateral, a 29 sleeve, and a 14 x 60 stem extension.  The trial was  impacted with excellent purchase.  We placed a 14-mm thickness trial rotating platform insert trial and the knee was reduced.  There is excellent stability  throughout, full extension and at 70 degrees of flexion, he has excellent varus, valgus, and  anterior and posterior stability.  The patella tracks normally.  I had to do a patelloplasty removing some of the soft tissue overlying the patella and there was also a lot of bone formation, excess bone formation around the periphery of the patella,  which is removed,  and which will prevent impingement.  I felt that there was excellent stability and excellent balance and the femoral component was definitely very stable, thus that did not need to be revised.  We subsequently took out the tibial trial and the cut bone surfaces prepared with pulsatile lavage.  Antibiotic cement was mixed and once ready for implantation, the tibia component, which is the size 4 Attune revision tibia with the 5 mm augments medial  and lateral, the 29 mm porous coated sleeve and the 14 x 60 press-fit stem.  We cemented proximally at the cut bone surface; otherwise, we utilized the press fit and the porous coating.  When the component is impacted, then extruded cement was removed.   When the cement is fully hardened, then a permanent 14-mm posterior stabilized rotating platform insert was placed.  The knee was reduced with outstanding stability throughout full range of motion.  The wound was copiously irrigated with saline  solution.  The permanent 14-mm posterior stabilized rotating platform insert was placed. Further irrigation was performed and then the arthrotomy was closed with a running 0 Stratafix suture.  Tourniquet was released total time of 34 minutes.   Electrocautery was used to stop any minor surface bleeding.  The subcu was then closed with interrupted 2-0 Vicryl and subcuticular running 4-0 Monocryl.  Incisions cleaned and dried and Dermabond and sterile dressing applied.  She was then awakened and  transported to recovery in stable condition.  NEED FOR SURGICAL ASSISTANT:  Please note that a surgical assistant is a medical necessity for this procedure to do it in a safe and expeditious manner.  Surgical assistance required for retraction of vital ligaments and neurovascular structures and for  proper positioning of the limb, for safe removal of the old implant and safe and accurate replacement of the new implant.   MUK D: 01/23/2023 2:46:31 pm T: 01/23/2023 8:59:00 pm   JOB: [id number]/ [id number]

## 2023-01-23 NOTE — Anesthesia Postprocedure Evaluation (Signed)
Anesthesia Post Note  Patient: Tracey Kramer  Procedure(s) Performed: Left knee tibial knee arthroplasty revision (Left: Knee)     Patient location during evaluation: PACU Anesthesia Type: Spinal Level of consciousness: awake and alert Pain management: pain level controlled Vital Signs Assessment: post-procedure vital signs reviewed and stable Respiratory status: spontaneous breathing and respiratory function stable Cardiovascular status: blood pressure returned to baseline and stable Postop Assessment: spinal receding and no apparent nausea or vomiting Anesthetic complications: no   No notable events documented.  Last Vitals:  Vitals:   01/23/23 1545 01/23/23 1616  BP: (!) 117/46 (!) 112/49  Pulse: 73 72  Resp: 16 16  Temp: 36.5 C 36.4 C  SpO2: 98% 98%    Last Pain:  Vitals:   01/23/23 1632  TempSrc:   PainSc: 3                  Beryle Lathe

## 2023-01-23 NOTE — Anesthesia Procedure Notes (Signed)
Spinal  Patient location during procedure: OR Start time: 01/23/2023 1:28 PM End time: 01/23/2023 1:32 PM Reason for block: surgical anesthesia Staffing Performed: anesthesiologist  Anesthesiologist: Beryle Lathe, MD Performed by: Beryle Lathe, MD Authorized by: Beryle Lathe, MD   Preanesthetic Checklist Completed: patient identified, IV checked, risks and benefits discussed, surgical consent, monitors and equipment checked, pre-op evaluation and timeout performed Spinal Block Patient position: sitting Prep: DuraPrep Patient monitoring: heart rate, cardiac monitor, continuous pulse ox and blood pressure Approach: midline Location: L4-5 Injection technique: single-shot Needle Needle type: Quincke  Needle gauge: 22 G Additional Notes Consent was obtained prior to the procedure with all questions answered and concerns addressed. Risks including, but not limited to, bleeding, infection, nerve damage, paralysis, failed block, inadequate analgesia, allergic reaction, high spinal, itching, and headache were discussed and the patient wished to proceed. Functioning IV was confirmed and monitors were applied. Sterile prep and drape, including hand hygiene, mask, and sterile gloves were used. The patient was positioned and the spine was prepped. The skin was anesthetized with lidocaine. Free flow of clear CSF was obtained prior to injecting local anesthetic into the CSF. The spinal needle aspirated freely following injection. The needle was carefully withdrawn. The patient tolerated the procedure well.   Leslye Peer, MD

## 2023-01-23 NOTE — Discharge Instructions (Signed)
Ollen Gross, MD Total Joint Specialist [facility] [address] [place], [place] [address] [phone] POSTOPERATIVE DIRECTIONS  Knee Rehabilitation, Guidelines Following Surgery  Results after knee surgery are often greatly improved when you follow the exercise, range of motion and muscle strengthening exercises prescribed by your doctor. Safety measures are also important to protect the knee from further injury. If any of these exercises cause you to have increased pain or swelling in your knee joint, decrease the amount until you are comfortable again and slowly increase them. If you have problems or questions, call your caregiver or physical therapist for advice.   BLOOD CLOT PREVENTION Take an 81 mg Aspirin two times a day for three weeks following surgery. Then take an 81 mg Aspirin once a day for three weeks. Then discontinue Aspirin. You may resume your vitamins/supplements upon discharge from the hospital. Do not take any NSAIDs (Advil, Aleve, Ibuprofen, Meloxicam, etc.) until you are 3 weeks out from surgery  HOME CARE INSTRUCTIONS  Remove items at home which could result in a fall. This includes throw rugs or furniture in walking pathways.  ICE to the affected knee as much as tolerated. Icing helps control swelling. If the swelling is well controlled you will be more comfortable and rehab easier. Continue to use ice on the knee for pain and swelling from surgery. You may notice swelling that will progress down to the foot and ankle. This is normal after surgery. Elevate the leg when you are not up walking on it.    Continue to use the breathing machine which will help keep your temperature down. It is common for your temperature to cycle up and down following surgery, especially at night when you are not up moving around and exerting yourself. The breathing machine keeps your lungs expanded and your temperature down. Do not place pillow under the  operative knee, focus on keeping the knee straight while resting  DIET You may resume your previous home diet once you are discharged from the hospital.  DRESSING / WOUND CARE / SHOWERING Keep your bulky bandage on for 2 days. On the third post-operative day you may remove the Ace bandage and gauze. There is a waterproof adhesive bandage on your skin which will stay in place until your first follow-up appointment. Once you remove this you will not need to place another bandage You may begin showering 3 days following surgery, but do not submerge the incision under water.  ACTIVITY For the first 5 days, the key is rest and control of pain and swelling Do your home exercises twice a day starting on post-operative day 3. On the days you go to physical therapy, just do the home exercises once that day. You should rest, ice and elevate the leg for 50 minutes out of every hour. Get up and walk/stretch for 10 minutes per hour. After 5 days you can increase your activity slowly as tolerated. Walk with your walker as instructed. Use the walker until you are comfortable transitioning to a cane. Walk with the cane in the opposite hand of the operative leg. You may discontinue the cane once you are comfortable and walking steadily. Avoid periods of inactivity such as sitting longer than an hour when not asleep. This helps prevent blood clots.  You may discontinue the knee immobilizer once you are able to perform a straight leg raise while lying down. You may resume a sexual relationship in one month or when given the OK by  your doctor.  You may return to work once you are cleared by your doctor.  Do not drive a car for 6 weeks or until released by your surgeon.  Do not drive while taking narcotics.  TED HOSE STOCKINGS Wear the elastic stockings on both legs for three weeks following surgery during the day. You may remove them at night for sleeping.  WEIGHT BEARING Weight bearing as tolerated with assist  device (walker, cane, etc) as directed, use it as long as suggested by your surgeon or therapist, typically at least 4-6 weeks.  POSTOPERATIVE CONSTIPATION PROTOCOL Constipation - defined medically as fewer than three stools per week and severe constipation as less than one stool per week.  One of the most common issues patients have following surgery is constipation.  Even if you have a regular bowel pattern at home, your normal regimen is likely to be disrupted due to multiple reasons following surgery.  Combination of anesthesia, postoperative narcotics, change in appetite and fluid intake all can affect your bowels.  In order to avoid complications following surgery, here are some recommendations in order to help you during your recovery period.  Colace (docusate) - Pick up an over-the-counter form of Colace or another stool softener and take twice a day as long as you are requiring postoperative pain medications.  Take with a full glass of water daily.  If you experience loose stools or diarrhea, hold the colace until you stool forms back up. If your symptoms do not get better within 1 week or if they get worse, check with your doctor. Dulcolax (bisacodyl) - Pick up over-the-counter and take as directed by the product packaging as needed to assist with the movement of your bowels.  Take with a full glass of water.  Use this product as needed if not relieved by Colace only.  MiraLax (polyethylene glycol) - Pick up over-the-counter to have on hand. MiraLax is a solution that will increase the amount of water in your bowels to assist with bowel movements.  Take as directed and can mix with a glass of water, juice, soda, coffee, or tea. Take if you go more than two days without a movement. Do not use MiraLax more than once per day. Call your doctor if you are still constipated or irregular after using this medication for 7 days in a row.  If you continue to have problems with postoperative constipation,  please contact the office for further assistance and recommendations.  If you experience "the worst abdominal pain ever" or develop nausea or vomiting, please contact the office immediatly for further recommendations for treatment.  ITCHING If you experience itching with your medications, try taking only a single pain pill, or even half a pain pill at a time.  You can also use Benadryl over the counter for itching or also to help with sleep.   MEDICATIONS See your medication summary on the "After Visit Summary" that the nursing staff will review with you prior to discharge.  You may have some home medications which will be placed on hold until you complete the course of blood thinner medication.  It is important for you to complete the blood thinner medication as prescribed by your surgeon.  Continue your approved medications as instructed at time of discharge.  PRECAUTIONS If you experience chest pain or shortness of breath - call 911 immediately for transfer to the hospital emergency department.  If you develop a fever greater that 101 F, purulent drainage from wound, increased redness  or drainage from wound, foul odor from the wound/dressing, or calf pain - CONTACT YOUR SURGEON.                                                   FOLLOW-UP APPOINTMENTS Make sure you keep all of your appointments after your operation with your surgeon and caregivers. You should call the office at the above phone number and make an appointment for approximately two weeks after the date of your surgery or on the date instructed by your surgeon outlined in the "After Visit Summary".  RANGE OF MOTION AND STRENGTHENING EXERCISES  Rehabilitation of the knee is important following a knee injury or an operation. After just a few days of immobilization, the muscles of the thigh which control the knee become weakened and shrink (atrophy). Knee exercises are designed to build up the tone and strength of the thigh muscles and to  improve knee motion. Often times heat used for twenty to thirty minutes before working out will loosen up your tissues and help with improving the range of motion but do not use heat for the first two weeks following surgery. These exercises can be done on a training (exercise) mat, on the floor, on a table or on a bed. Use what ever works the best and is most comfortable for you Knee exercises include:  Leg Lifts - While your knee is still immobilized in a splint or cast, you can do straight leg raises. Lift the leg to 60 degrees, hold for 3 sec, and slowly lower the leg. Repeat 10-20 times 2-3 times daily. Perform this exercise against resistance later as your knee gets better.  Quad and Hamstring Sets - Tighten up the muscle on the front of the thigh (Quad) and hold for 5-10 sec. Repeat this 10-20 times hourly. Hamstring sets are done by pushing the foot backward against an object and holding for 5-10 sec. Repeat as with quad sets.  Leg Slides: Lying on your back, slowly slide your foot toward your buttocks, bending your knee up off the floor (only go as far as is comfortable). Then slowly slide your foot back down until your leg is flat on the floor again. Angel Wings: Lying on your back spread your legs to the side as far apart as you can without causing discomfort.  A rehabilitation program following serious knee injuries can speed recovery and prevent re-injury in the future due to weakened muscles. Contact your doctor or a physical therapist for more information on knee rehabilitation.   POST-OPERATIVE OPIOID TAPER INSTRUCTIONS: It is important to wean off of your opioid medication as soon as possible. If you do not need pain medication after your surgery it is ok to stop day one. Opioids include: Codeine, Hydrocodone(Norco, Vicodin), Oxycodone(Percocet, oxycontin) and hydromorphone amongst others.  Long term and even short term use of opiods can cause: Increased pain  response Dependence Constipation Depression Respiratory depression And more.  Withdrawal symptoms can include Flu like symptoms Nausea, vomiting And more Techniques to manage these symptoms Hydrate well Eat regular healthy meals Stay active Use relaxation techniques(deep breathing, meditating, yoga) Do Not substitute Alcohol to help with tapering If you have been on opioids for less than two weeks and do not have pain than it is ok to stop all together.  Plan to wean off of opioids This  plan should start within one week post op of your joint replacement. Maintain the same interval or time between taking each dose and first decrease the dose.  Cut the total daily intake of opioids by one tablet each day Next start to increase the time between doses. The last dose that should be eliminated is the evening dose.   IF YOU ARE TRANSFERRED TO A SKILLED REHAB FACILITY If the patient is transferred to a skilled rehab facility following release from the hospital, a list of the current medications will be sent to the facility for the patient to continue.  When discharged from the skilled rehab facility, please have the facility set up the patient's Home Health Physical Therapy prior to being released. Also, the skilled facility will be responsible for providing the patient with their medications at time of release from the facility to include their pain medication, the muscle relaxants, and their blood thinner medication. If the patient is still at the rehab facility at time of the two week follow up appointment, the skilled rehab facility will also need to assist the patient in arranging follow up appointment in our office and any transportation needs.  MAKE SURE YOU:  Understand these instructions.  Get help right away if you are not doing well or get worse.   DENTAL ANTIBIOTICS:  In most cases prophylactic antibiotics for Dental procdeures after total joint surgery are not  necessary.  Exceptions are as follows:  1. History of prior total joint infection  2. Severely immunocompromised (Organ Transplant, cancer chemotherapy, Rheumatoid biologic meds such as Humera)  3. Poorly controlled diabetes (A1C &gt; 8.0, blood glucose over 200)  If you have one of these conditions, contact your surgeon for an antibiotic prescription, prior to your dental procedure.    Pick up stool softner and laxative for home use following surgery while on pain medications. Do not submerge incision under water. Please use good hand washing techniques while changing dressing each day. May shower starting three days after surgery. Please use a clean towel to pat the incision dry following showers. Continue to use ice for pain and swelling after surgery. Do not use any lotions or creams on the incision until instructed by your surgeon.

## 2023-01-23 NOTE — Progress Notes (Signed)
Orthopedic Tech Progress Note Patient Details:  Tracey Kramer 1946-02-08 [id number]  Ortho Devices Type of Ortho Device: CPM padding Ortho Device/Splint Interventions: Ordered, Application, Adjustment   Post Interventions Patient Tolerated: Well Instructions Provided: Adjustment of device, Care of device  Kizzie Fantasia 01/23/2023, 3:31 PM

## 2023-01-23 NOTE — Progress Notes (Signed)
Orthopedic Tech Progress Note Patient Details:  Tracey Kramer [facility] [birth year] [id number]  CPM Left Knee CPM Left Knee: On Left Knee Flexion (Degrees): 40 Left Knee Extension (Degrees): 10  Post Interventions Patient Tolerated: Well Instructions Provided: Adjustment of device, Care of device  Kizzie Fantasia 01/23/2023, 3:31 PM

## 2023-01-23 NOTE — Evaluation (Signed)
Physical Therapy Evaluation Patient Details Name: Mea Ozga MRN: [id number] DOB: Feb 04, 1946 Today's Date: 01/23/2023  History of Present Illness  77 yo female presents to therapy s/p L TKA revision on 01/23/2023 due to aseptic loosening of tibial component and failure of conservative measures. Pt PMH includes but is not limited to: colitis, HLD, HTN, hypothyroidism, aortic regurgitation, depression, HOH, IBS, and L TKA (2019).  Clinical Impression      Mikela Senn is a 77 y.o. female POD 0 s/p L TKA revision. Patient reports IND with mobility at baseline. Patient is now limited by functional impairments (see PT problem list below) and requires CGA for bed mobility and min A and cues for transfers. Patient was unable to safely ambulate due to L LE instability with pre-gait tasks.  Patient instructed in exercise to facilitate ROM and circulation to manage edema. Patient will benefit from continued skilled PT interventions to address impairments and progress towards PLOF. Acute PT will follow to progress mobility and stair training in preparation for safe discharge home with family and caregiver support with OPPT services.    If plan is discharge home, recommend the following: A lot of help with walking and/or transfers;A little help with bathing/dressing/bathroom;Assistance with cooking/housework;Assist for transportation;Help with stairs or ramp for entrance   Can travel by private vehicle        Equipment Recommendations None recommended by PT  Recommendations for Other Services       Functional Status Assessment Patient has had a recent decline in their functional status and demonstrates the ability to make significant improvements in function in a reasonable and predictable amount of time.     Precautions / Restrictions Precautions Precautions: Knee;Fall Restrictions Weight Bearing Restrictions: No      Mobility  Bed Mobility Overal bed mobility: Needs Assistance Bed  Mobility: Supine to Sit     Supine to sit: Contact guard, HOB elevated     General bed mobility comments: min cues    Transfers Overall transfer level: Needs assistance Equipment used: Rolling walker (2 wheels) Transfers: Sit to/from Stand, Bed to chair/wheelchair/BSC Sit to Stand: Contact guard assist Stand pivot transfers: Min assist         General transfer comment: min cues for sit to stand, PT noted quad lag in supine with SLR and SAQ and when progressing pt with pregait assessment noted L knee instability requiring max A to recover, min A with instruction for use of B UE support at RW to offload L LE with weight acceptance for SPT to recliner    Ambulation/Gait               General Gait Details: NT for pt and staff safety due to L LE instabiltiy attributed to slow regresison of anesthesia  Stairs            Wheelchair Mobility     Tilt Bed    Modified [name] (Stroke Patients Only)       Balance Overall balance assessment: Needs assistance Sitting-balance support: Feet supported Sitting balance-Leahy Scale: Good     Standing balance support: Bilateral upper extremity supported, During functional activity, Reliant on assistive device for balance Standing balance-Leahy Scale: Poor                               Pertinent Vitals/Pain Pain Assessment Pain Assessment: 0-10 Pain Score: 5  Pain Descriptors / Indicators: Aching, Constant, Discomfort, Heaviness, Operative site guarding  Pain Intervention(s): Monitored during session, Limited activity within patient's tolerance, Premedicated before session, Repositioned, Ice applied, Patient requesting pain meds-RN notified    Home Living Family/patient expects to be discharged to:: Private residence Living Arrangements: Alone Available Help at Discharge: Family;Available 24 hours/day (son and daughter in law will be staying with pt and have some caregiver assist during the day) Type of  Home: Independent living facility [facility]) Home Access: Level entry       Home Layout: One level Home Equipment: Agricultural consultant (2 wheels)      Prior Function Prior Level of Function : Independent/Modified Independent;Driving             Mobility Comments: IND no AD for all ADLs, self care tasks and IADLs       Extremity/Trunk Assessment        Lower Extremity Assessment Lower Extremity Assessment: LLE deficits/detail LLE Deficits / Details: ankle DF/PF 5/5; SLR ? 10 degree lag and pt unable to perform SAQ LLE Sensation: WNL    Cervical / Trunk Assessment Cervical / Trunk Assessment: Kyphotic  Communication   Communication Communication: No apparent difficulties  Cognition Arousal: Alert Behavior During Therapy: WFL for tasks assessed/performed Overall Cognitive Status: Within Functional Limits for tasks assessed                                          General Comments      Exercises Total Joint Exercises Ankle Circles/Pumps: AROM, Both, 10 reps   Assessment/Plan    PT Assessment Patient needs continued PT services  PT Problem List Decreased strength;Decreased range of motion;Decreased activity tolerance;Decreased balance;Decreased mobility;Decreased coordination;Pain       PT Treatment Interventions DME instruction;Gait training;Functional mobility training;Therapeutic activities;Balance training;Therapeutic exercise;Neuromuscular re-education;Patient/family education;Modalities    PT Goals (Current goals can be found in the Care Plan section)  Acute Rehab PT Goals Patient Stated Goal: to be able to get in and out of the car more easily, improved L LE stabiltiy and no pain PT Goal Formulation: With patient Time For Goal Achievement: 02/06/23 Potential to Achieve Goals: Good    Frequency 7X/week     Co-evaluation               AM-PAC PT "6 Clicks" Mobility  Outcome Measure Help needed turning from your back to your side  while in a flat bed without using bedrails?: A Little Help needed moving from lying on your back to sitting on the side of a flat bed without using bedrails?: A Little Help needed moving to and from a bed to a chair (including a wheelchair)?: A Lot Help needed standing up from a chair using your arms (e.g., wheelchair or bedside chair)?: A Lot Help needed to walk in hospital room?: Total Help needed climbing 3-5 steps with a railing? : Total 6 Click Score: 12    End of Session Equipment Utilized During Treatment: Gait belt Activity Tolerance: Patient limited by pain;Other (comment) (L LE instabiltiy) Patient left: in chair;with call bell/phone within reach;with family/visitor present Nurse Communication: Mobility status PT Visit Diagnosis: Unsteadiness on feet (R26.81);Other abnormalities of gait and mobility (R26.89);Muscle weakness (generalized) (M62.81);Difficulty in walking, not elsewhere classified (R26.2);Pain Pain - Right/Left: Left Pain - part of body: Knee;Leg    Time: 7829-5621 PT Time Calculation (min) (ACUTE ONLY): 23 min   Charges:   PT Evaluation $PT Eval Low Complexity: 1 Low PT  Treatments $Therapeutic Activity: 8-22 mins PT General Charges $$ ACUTE PT VISIT: 1 Visit         Johnny Bridge, PT Acute Rehab   Jacqualyn Posey 01/23/2023, 6:32 PM

## 2023-01-23 NOTE — Transfer of Care (Signed)
Immediate Anesthesia Transfer of Care Note  Patient: Tracey Kramer  Procedure(s) Performed: Left knee tibial knee arthroplasty revision (Left: Knee)  Patient Location: PACU  Anesthesia Type:Spinal and MAC combined with regional for post-op pain  Level of Consciousness: drowsy and patient cooperative  Airway & Oxygen Therapy: Patient Spontanous Breathing and Patient connected to nasal cannula oxygen  Post-op Assessment: Report given to RN and Post -op Vital signs reviewed and stable  Post vital signs: Reviewed and stable  Last Vitals:  Vitals Value Taken Time  BP    Temp    Pulse    Resp    SpO2      Last Pain:  Vitals:   01/23/23 1239  TempSrc:   PainSc: 0-No pain         Complications: No notable events documented.

## 2023-01-23 NOTE — Anesthesia Procedure Notes (Signed)
Date/Time: 01/23/2023 1:33 PM  Performed by: Maurene Capes, CRNAOxygen Delivery Method: Simple face mask Placement Confirmation: positive ETCO2 Dental Injury: Teeth and Oropharynx as per pre-operative assessment

## 2023-01-23 NOTE — Progress Notes (Signed)
Orthopedic Tech Progress Note Patient Details:  Sutton Plake [facility] March 29, 1945 [id number]  CPM Left Knee CPM Left Knee: Off Left Knee Flexion (Degrees): 40 Left Knee Extension (Degrees): 10  Post Interventions Patient Tolerated: Well Instructions Provided: Adjustment of device, Care of device  Tracey Kramer 01/23/2023, 5:42 PM

## 2023-01-24 ENCOUNTER — Encounter (HOSPITAL_COMMUNITY): Payer: Self-pay | Admitting: Orthopedic Surgery

## 2023-01-24 LAB — BASIC METABOLIC PANEL
Anion gap: 9 (ref 5–15)
BUN: 11 mg/dL (ref 8–23)
CO2: 25 mmol/L (ref 22–32)
Calcium: 8.5 mg/dL — ABNORMAL LOW (ref 8.9–10.3)
Chloride: 105 mmol/L (ref 98–111)
Creatinine, Ser: 0.5 mg/dL (ref 0.44–1.00)
GFR, Estimated: >60 mL/min (ref >60)
Glucose, Bld: 140 mg/dL — ABNORMAL HIGH (ref 70–99)
Potassium: 3.7 mmol/L (ref 3.5–5.1)
Sodium: 139 mmol/L (ref 135–145)

## 2023-01-24 LAB — CBC
HCT: 31.5 % — ABNORMAL LOW (ref 36.0–46.0)
Hemoglobin: 10.4 g/dL — ABNORMAL LOW (ref 12.0–15.0)
MCH: 30 pg (ref 26.0–34.0)
MCHC: 33 g/dL (ref 30.0–36.0)
MCV: 90.8 fL (ref 80.0–100.0)
Platelets: 274 10*3/uL (ref 150–400)
RBC: 3.47 MIL/uL — ABNORMAL LOW (ref 3.87–5.11)
RDW: 12.7 % (ref 11.5–15.5)
WBC: 7.9 10*3/uL (ref 4.0–10.5)
nRBC: 0 % (ref 0.0–0.2)

## 2023-01-24 MED ORDER — ONDANSETRON HCL 4 MG PO TABS: 4.0000 mg | ORAL_TABLET | Freq: Four times a day (QID) | ORAL | 0 refills | Status: AC | PRN

## 2023-01-24 MED ORDER — SODIUM CHLORIDE 0.9 % IV BOLUS
250.0000 mL | Freq: Once | INTRAVENOUS | Status: AC
  Administered 2023-01-24: 250 mL via INTRAVENOUS

## 2023-01-24 MED ORDER — METHOCARBAMOL 500 MG PO TABS: 500.0000 mg | ORAL_TABLET | Freq: Four times a day (QID) | ORAL | 0 refills | Status: AC | PRN

## 2023-01-24 MED ORDER — ASPIRIN 81 MG PO CHEW: 81.0000 mg | CHEWABLE_TABLET | Freq: Two times a day (BID) | ORAL | 0 refills | Status: AC

## 2023-01-24 MED ORDER — OXYCODONE HCL 5 MG PO TABS: 5.0000 mg | ORAL_TABLET | Freq: Three times a day (TID) | ORAL | 0 refills | Status: AC | PRN

## 2023-01-24 NOTE — Progress Notes (Addendum)
Physical Therapy Treatment Patient Details Name: Tracey Kramer MRN: [id number] DOB: 10/26/1945 Today's Date: 01/24/2023   History of Present Illness 77 yo female presents to therapy s/p L TKA revision on 01/23/2023 due to aseptic loosening of tibial component and failure of conservative measures. Pt PMH includes but is not limited to: colitis, HLD, HTN, hypothyroidism, aortic regurgitation, depression, HOH, IBS, and L TKA (2019).    PT Comments  The patient reports feeling better. Ambulated x 80'   with RW BP standing  120/35, post ambulation 127/40. RN aware and to message MD. Recommend  delay DC until tomorrow due to BP concerns and  Left knee appears slightly  still weaker.   If plan is discharge home, recommend the following: A lot of help with walking and/or transfers;A little help with bathing/dressing/bathroom;Assistance with cooking/housework;Assist for transportation;Help with stairs or ramp for entrance   Can travel by private vehicle        Equipment Recommendations  None recommended by PT    Recommendations for Other Services       Precautions / Restrictions Precautions Precautions: Knee;Fall Required Braces or Orthoses: Knee Immobilizer - Right Knee Immobilizer - Right: Discontinue once straight leg raise with < 10 degree lag     Mobility  Bed Mobility   General bed mobility comments: in recliner    Transfers Overall transfer level: Needs assistance Equipment used: Rolling walker (2 wheels) Transfers: Sit to/from Stand Sit to Stand: Supervision   Step pivot transfers: Contact guard assist       General transfer comment: Left KI in place. Patient ambulated x 67' with RW, not feeling dizzy.    Ambulation/Gait    Patient ambulated x 80', no dizziness, used KI            Comptroller Bed    Modified [name] (Stroke Patients Only)       Balance Overall balance assessment: Needs  assistance Sitting-balance support: Feet supported Sitting balance-Leahy Scale: Good     Standing balance support: Bilateral upper extremity supported, During functional activity, Reliant on assistive device for balance Standing balance-Leahy Scale: Fair Standing balance comment: with KI                            Cognition Arousal: Alert                                              Exercises Total Joint Exercises Ankle Circles/Pumps: AROM, Both, 10 reps Quad Sets: AROM, Both, 10 reps Heel Slides: AROM, Left, 10 reps Straight Leg Raises: 10 reps, Left, AROM Goniometric ROM: 10-60 left knee    General Comments        Pertinent Vitals/Pain Pain Assessment Pain Score: 4  Pain Descriptors / Indicators: Discomfort, Sore Pain Intervention(s): Monitored during session    Home Living                          Prior Function            PT Goals (current goals can now be found in the care plan section) Progress towards PT goals: Progressing toward goals    Frequency    7X/week  PT Plan      Co-evaluation              AM-PAC PT "6 Clicks" Mobility   Outcome Measure  Help needed turning from your back to your side while in a flat bed without using bedrails?: None Help needed moving from lying on your back to sitting on the side of a flat bed without using bedrails?: A Little Help needed moving to and from a bed to a chair (including a wheelchair)?: A Little Help needed standing up from a chair using your arms (e.g., wheelchair or bedside chair)?: A Little Help needed to walk in hospital room?: A Little Help needed climbing 3-5 steps with a railing? : A Little 6 Click Score: 19    End of Session Equipment Utilized During Treatment: Gait belt;Left knee immobilizer Activity Tolerance: Patient tolerated treatment well Patient left: in chair;with call bell/phone within reach;with family/visitor present;with chair  alarm set Nurse Communication: Mobility status (BP) PT Visit Diagnosis: Unsteadiness on feet (R26.81);Other abnormalities of gait and mobility (R26.89);Muscle weakness (generalized) (M62.81);Difficulty in walking, not elsewhere classified (R26.2);Pain Pain - Right/Left: Left Pain - part of body: Knee;Leg     Time: 4782-9562 PT Time Calculation (min) (ACUTE ONLY): 24 min  Charges:    $Gait Training: 23-37 mins PT General Charges $$ ACUTE PT VISIT: 1 Visit                     Blanchard Kelch PT Acute Rehabilitation Services Office [phone] Weekend pager-[phone]   Rada Hay 01/24/2023, 12:58 PM

## 2023-01-24 NOTE — Progress Notes (Signed)
Physical Therapy Treatment Patient Details Name: Tracey Kramer MRN: [id number] DOB: Apr 21, 1945 Today's Date: 01/24/2023   History of Present Illness 77 yo female presents to therapy s/p L TKA revision on 01/23/2023 due to aseptic loosening of tibial component and failure of conservative measures. Pt PMH includes but is not limited to: colitis, HLD, HTN, hypothyroidism, aortic regurgitation, depression, HOH, IBS, and L TKA (2019).    PT Comments  Patient  ambulated x 160' with RW, Used KI first 100', then removed  and ambulated x 60' with no signs of Left knee buckling. BP 136/45.    If plan is discharge home, recommend the following: A lot of help with walking and/or transfers;A little help with bathing/dressing/bathroom;Assistance with cooking/housework;Assist for transportation;Help with stairs or ramp for entrance   Can travel by private vehicle        Equipment Recommendations  None recommended by PT    Recommendations for Other Services       Precautions / Restrictions Precautions Precautions: Knee;Fall Precaution Comments: KI removed at end of walk, knee stable Required Braces or Orthoses: Knee Immobilizer - Right Knee Immobilizer - Right: Discontinue once straight leg raise with < 10 degree lag     Mobility  Bed Mobility Overal bed mobility: Needs Assistance Bed Mobility: Supine to Sit     Supine to sit: Supervision     General bed mobility comments: in recliner    Transfers Overall transfer level: Needs assistance Equipment used: Rolling walker (2 wheels) Transfers: Sit to/from Stand Sit to Stand: Supervision   Step pivot transfers: Contact guard assist       G   Ambulation/Gait Ambulation/Gait assistance: Contact guard assist Gait Distance (Feet): 160 Feet w/KI x 100, w/out 60' Assistive device: Rolling walker (2 wheels) Gait Pattern/deviations: Step-through pattern Gait velocity: decr     General Gait Details: tolerated well. Left knee stable  after KI removed and ambulated x 60'   Stairs             Wheelchair Mobility     Tilt Bed    Modified [name] (Stroke Patients Only)       Balance Overall balance assessment: Needs assistance, Independent Sitting-balance support: Feet supported Sitting balance-Leahy Scale: Good     Standing balance support: Bilateral upper extremity supported, During functional activity, Reliant on assistive device for balance Standing balance-Leahy Scale: Fair Standing balance comment: with KI                            Cognition Arousal: Alert                                              Exercises Long arc quads x 10 on  left seated    General Comments        Pertinent Vitals/Pain Pain Assessment Pain Score: 1  Pain Descriptors / Indicators: Discomfort, Sore Pain Intervention(s): Monitored during session, Premedicated before session, Ice applied    Home Living                          Prior Function            PT Goals (current goals can now be found in the care plan section) Progress towards PT goals: Progressing toward goals    Frequency  7X/week      PT Plan      Co-evaluation              AM-PAC PT "6 Clicks" Mobility   Outcome Measure  Help needed turning from your back to your side while in a flat bed without using bedrails?: None Help needed moving from lying on your back to sitting on the side of a flat bed without using bedrails?: A Little Help needed moving to and from a bed to a chair (including a wheelchair)?: A Little Help needed standing up from a chair using your arms (e.g., wheelchair or bedside chair)?: A Little Help needed to walk in hospital room?: A Little Help needed climbing 3-5 steps with a railing? : A Little 6 Click Score: 19    End of Session Equipment Utilized During Treatment: Gait belt Activity Tolerance: Patient tolerated treatment well Patient left: in chair;with call  bell/phone within reach;with family/visitor present;with chair alarm set Nurse Communication: Mobility status PT Visit Diagnosis: Unsteadiness on feet (R26.81);Other abnormalities of gait and mobility (R26.89);Muscle weakness (generalized) (M62.81);Difficulty in walking, not elsewhere classified (R26.2);Pain Pain - Right/Left: Left Pain - part of body: Knee;Leg     Time: 1610-9604 PT Time Calculation (min) (ACUTE ONLY): 23 min  Charges:    $Gait Training: 23-37 mins $PT General Charges $$ ACUTE PT VISIT: 1 Visit                      Rada Hay 01/24/2023, 3:54 PM

## 2023-01-24 NOTE — Plan of Care (Signed)
  Problem: Health Behavior/Discharge Planning: Goal: Ability to manage health-related needs will improve Outcome: Progressing   Problem: Clinical Measurements: Goal: Ability to maintain clinical measurements within normal limits will improve Outcome: Progressing Goal: Will remain free from infection Outcome: Progressing Goal: Diagnostic test results will improve Outcome: Progressing Goal: Respiratory complications will improve Outcome: Progressing Goal: Cardiovascular complication will be avoided Outcome: Progressing   Problem: Activity: Goal: Risk for activity intolerance will decrease Outcome: Progressing   Problem: Nutrition: Goal: Adequate nutrition will be maintained Outcome: Completed/Met   Problem: Coping: Goal: Level of anxiety will decrease Outcome: Progressing   Problem: Elimination: Goal: Will not experience complications related to bowel motility Outcome: Progressing Goal: Will not experience complications related to urinary retention Outcome: Progressing   Problem: Pain Management: Goal: General experience of comfort will improve Outcome: Progressing   Problem: Safety: Goal: Ability to remain free from injury will improve Outcome: Progressing   Problem: Skin Integrity: Goal: Risk for impaired skin integrity will decrease Outcome: Progressing   Problem: Education: Goal: Knowledge of the prescribed therapeutic regimen will improve Outcome: Progressing Goal: Individualized Educational Video(s) Outcome: Completed/Met   Problem: Activity: Goal: Ability to avoid complications of mobility impairment will improve Outcome: Progressing Goal: Range of joint motion will improve Outcome: Adequate for Discharge   Problem: Clinical Measurements: Goal: Postoperative complications will be avoided or minimized Outcome: Progressing   Problem: Pain Management: Goal: Pain level will decrease with appropriate interventions Outcome: Progressing   Problem: Skin  Integrity: Goal: Will show signs of wound healing Outcome: Progressing

## 2023-01-24 NOTE — Progress Notes (Signed)
Subjective: 1 Day Post-Op Procedure(s) (LRB): Left knee tibial knee arthroplasty revision (Left) Patient seen in rounds by Dr. Lequita Halt. Patient is well, and has had no acute complaints or problems. BPs low overnight and this morning although she denies any dizziness or lightheadedness. Denies SOB or chest pain. Denies calf pain. Foley cath removed this AM. Patient reports pain as moderate. Worked with physical therapy yesterday. We will continue physical therapy today.  Objective: Vital signs in last 24 hours: Temp:  [97.5 F (36.4 C)-98.2 F (36.8 C)] 98.2 F (36.8 C) (11/07 0553) Pulse Rate:  [61-75] 61 (11/07 0553) Resp:  [12-17] 16 (11/07 0553) BP: (102-147)/(39-54) 102/39 (11/07 0553) SpO2:  [94 %-100 %] 98 % (11/07 0553) Weight:  [56.2 kg] 56.2 kg (11/06 1117)  Intake/Output from previous day:  Intake/Output Summary (Last 24 hours) at 01/24/2023 0737 Last data filed at 01/24/2023 0600 Gross per 24 hour  Intake 3215 ml  Output 2300 ml  Net 915 ml     Intake/Output this shift: No intake/output data recorded.  Labs: Recent Labs    01/24/23 0327  HGB 10.4*   Recent Labs    01/24/23 0327  WBC 7.9  RBC 3.47*  HCT 31.5*  PLT 274   Recent Labs    01/24/23 0327  NA 139  K 3.7  CL 105  CO2 25  BUN 11  CREATININE 0.50  GLUCOSE 140*  CALCIUM 8.5*   No results for input(s): "LABPT", "INR" in the last 72 hours.  Exam: General - Patient is Alert and Oriented Extremity - Neurologically intact Neurovascular intact Sensation intact distally Dorsiflexion/Plantar flexion intact Dressing - dressing C/D/I Motor Function - intact, moving foot and toes well on exam.  Past Medical History:  Diagnosis Date   Acid reflux occasional   Acute lateral meniscal tear LEFT KNEE   Anemia    Anxiety    AR (aortic regurgitation) 11/06/2017   Moderate, noted on ECHO   Arthritis    Constipation    Depression    Heart murmur    Heart palpitations    HOH (hard of  hearing)    Hyperlipidemia    Hypertension    Hypothyroidism    IBS (irritable bowel syndrome)    MR (mitral regurgitation) 11/06/2017   Mild, noted on ECHO   PONV (postoperative nausea and vomiting)    PR (pulmonary regurgitation) 11/11/2017   Mild, noted on ECHO   Thyroid disease    hypothyroidism    TR (tricuspid regurgitation) 11/11/2017   Mild, noted on ECHO    Assessment/Plan: 1 Day Post-Op Procedure(s) (LRB): Left knee tibial knee arthroplasty revision (Left) Principal Problem:   Failed total knee arthroplasty (HCC) Active Problems:   Failed total left knee replacement (HCC)  Estimated body mass index is 22.32 kg/m as calculated from the following:   Height as of this encounter: 5' 2.5" (1.588 m).   Weight as of this encounter: 56.2 kg. Advance diet Up with therapy D/C IV fluids  Anticipated LOS equal to or greater than 2 midnights due to - Age 62 and older with one or more of the following:  - Obesity  - Expected need for hospital services (PT, OT, Nursing) required for safe  discharge  - Anticipated need for postoperative skilled nursing care or inpatient rehab  - Active co-morbidities: None OR   - Unanticipated findings during/Post Surgery: Slow post-op progression: GI, pain control, mobility  - Patient is a high risk of re-admission due to: None  DVT Prophylaxis - Aspirin Weight bearing as tolerated.  Continue physical therapy. Possible discharge home today pending progress, if meeting patient goals, and symptoms managed. Scheduled for OPPT at [facility] in [place]. Follow-up in clinic in 2 weeks.  The PDMP database was reviewed today prior to any opioid medications being prescribed to this patient.  R. Arcola Jansky, PA-C Orthopedic Surgery 01/24/2023, 7:37 AM

## 2023-01-24 NOTE — Plan of Care (Signed)
  Problem: Coping: Goal: Level of anxiety will decrease Outcome: Progressing   Problem: Pain Management: Goal: General experience of comfort will improve Outcome: Progressing

## 2023-01-24 NOTE — Progress Notes (Signed)
Physical Therapy Treatment Patient Details Name: Tracey Kramer MRN: [id number] DOB: 21-Jan-1946 Today's Date: 01/24/2023   History of Present Illness 76 yo female presents to therapy s/p L TKA revision on 01/23/2023 due to aseptic loosening of tibial component and failure of conservative measures. Pt PMH includes but is not limited to: colitis, HLD, HTN, hypothyroidism, aortic regurgitation, depression, HOH, IBS, and L TKA (2019).    PT Comments  The patient  assisted to standing, noted L knee buckled without KI. Placed KI  on LLE to transfer to [facility] then recliner.Patient reports feeling weak and not "right."  BP supine 114/38, sitting 122/48 after transfer 121/47 .  Will see again  to  check BP and progress ambulation as tolerated.   If plan is discharge home, recommend the following: A lot of help with walking and/or transfers;A little help with bathing/dressing/bathroom;Assistance with cooking/housework;Assist for transportation;Help with stairs or ramp for entrance   Can travel by private vehicle        Equipment Recommendations  None recommended by PT    Recommendations for Other Services       Precautions / Restrictions Precautions Precautions: Knee;Fall Required Braces or Orthoses: Knee Immobilizer - Right Knee Immobilizer - Right: Discontinue once straight leg raise with < 10 degree lag     Mobility  Bed Mobility Overal bed mobility: Needs Assistance Bed Mobility: Supine to Sit     Supine to sit: Supervision          Transfers Overall transfer level: Needs assistance Equipment used: Rolling walker (2 wheels) Transfers: Sit to/from Stand, Bed to chair/wheelchair/BSC Sit to Stand: Mod assist   Step pivot transfers: Contact guard assist       General transfer comment: patient stood at Rw, KI not in place. Asked patient to stand and shift weight shift to the left, knee buckled, patient sat down on the bed quickly. Placed KI  then transferred to [facility] then to  recliner using RW, pt reporting feeling  light headed and weak.    Ambulation/Gait               General Gait Details: NT for pt and staff safety due to L LE instabiltiy  and patient feeling nausea and weakness   Stairs             Wheelchair Mobility     Tilt Bed    Modified [name] (Stroke Patients Only)       Balance Overall balance assessment: Needs assistance Sitting-balance support: Feet supported Sitting balance-Leahy Scale: Good     Standing balance support: Bilateral upper extremity supported, During functional activity, Reliant on assistive device for balance Standing balance-Leahy Scale: Poor                              Cognition Arousal: Alert                                              Exercises Total Joint Exercises Ankle Circles/Pumps: AROM, Both, 10 reps Quad Sets: AROM, Both, 10 reps Heel Slides: AROM, Left, 10 reps Straight Leg Raises: 10 reps, Left, AROM Goniometric ROM: 10-60 left knee    General Comments        Pertinent Vitals/Pain Pain Assessment Pain Score: 2  Pain Descriptors / Indicators: Discomfort Pain Intervention(s): Monitored during session,  Premedicated before session, Ice applied    Home Living                          Prior Function            PT Goals (current goals can now be found in the care plan section) Progress towards PT goals: Not progressing toward goals - comment (limited with low BP and left knee buckling)    Frequency    7X/week      PT Plan      Co-evaluation              AM-PAC PT "6 Clicks" Mobility   Outcome Measure  Help needed turning from your back to your side while in a flat bed without using bedrails?: A Little Help needed moving from lying on your back to sitting on the side of a flat bed without using bedrails?: A Little Help needed moving to and from a bed to a chair (including a wheelchair)?: A Lot Help needed  standing up from a chair using your arms (e.g., wheelchair or bedside chair)?: A Lot Help needed to walk in hospital room?: Total Help needed climbing 3-5 steps with a railing? : Total 6 Click Score: 12    End of Session Equipment Utilized During Treatment: Gait belt Activity Tolerance: Patient limited by pain;Other (comment) (BP low) Patient left: in chair;with call bell/phone within reach;with family/visitor present;with chair alarm set Nurse Communication: Mobility status (low BP) PT Visit Diagnosis: Unsteadiness on feet (R26.81);Other abnormalities of gait and mobility (R26.89);Muscle weakness (generalized) (M62.81);Difficulty in walking, not elsewhere classified (R26.2);Pain Pain - Right/Left: Left Pain - part of body: Knee;Leg     Time: 4098-1191 PT Time Calculation (min) (ACUTE ONLY): 44 min  Charges:    $Therapeutic Exercise: 8-22 mins $Therapeutic Activity: 8-22 mins $Self Care/Home Management: 8-22 PT General Charges $$ ACUTE PT VISIT: 1 Visit                   Tracey Kramer PT Acute Rehabilitation Services Office [phone] Weekend [phone] }   Rada Hay 01/24/2023, 12:43 PM

## 2023-01-24 NOTE — TOC Transition Note (Signed)
Transition of Care [facility]) - CM/SW Discharge Note   Patient Details  Name: [name] MRN: [id number] Date of Birth: [birth year]  Transition of [facility] CM/SW Contact:  Amada Jupiter, LCSW Phone Number: 01/24/2023, 11:09 AM   Clinical Narrative:     Met with pt and spouse who confirm she has needed DME in the home.  OPPT already arranged with [facility]).  No TOC needs.  Final next level of care: OP Rehab Barriers to Discharge: No Barriers Identified   Patient Goals and CMS Choice      Discharge Placement                         Discharge Plan and Services Additional resources added to the After Visit Summary for                  DME Arranged: N/A DME Agency: NA                  Social Determinants of Health (SDOH) Interventions SDOH Screenings   Food Insecurity: No Food Insecurity (01/23/2023)  Housing: Low Risk  (01/23/2023)  Transportation Needs: No Transportation Needs (01/23/2023)  Utilities: Not At Risk (01/23/2023)  Social Connections: Unknown (08/01/2021)   Received from [facility], [facility]  Tobacco Use: Low Risk  (01/23/2023)     Readmission Risk Interventions    01/24/2023   11:08 AM  Readmission Risk Prevention Plan  Post Dischage Appt Complete  Medication Screening Complete  Transportation Screening Complete

## 2023-01-25 LAB — CBC
HCT: 30.3 % — ABNORMAL LOW (ref 36.0–46.0)
Hemoglobin: 10 g/dL — ABNORMAL LOW (ref 12.0–15.0)
MCH: 30.5 pg (ref 26.0–34.0)
MCHC: 33 g/dL (ref 30.0–36.0)
MCV: 92.4 fL (ref 80.0–100.0)
Platelets: 251 10*3/uL (ref 150–400)
RBC: 3.28 MIL/uL — ABNORMAL LOW (ref 3.87–5.11)
RDW: 13.2 % (ref 11.5–15.5)
WBC: 8.6 10*3/uL (ref 4.0–10.5)
nRBC: 0 % (ref 0.0–0.2)

## 2023-01-25 NOTE — Care Management Important Message (Signed)
Important Message  Patient Details IM Letter given. Name: Tracey Kramer MRN: [id number] Date of Birth: [birth year]   Important Message Given:  Yes - Medicare IM     Caren Macadam 01/25/2023, 11:50 AM

## 2023-01-25 NOTE — Plan of Care (Signed)
  Problem: Activity: Goal: Risk for activity intolerance will decrease Outcome: Progressing   Problem: Pain Management: Goal: General experience of comfort will improve Outcome: Progressing   Problem: Education: Goal: Knowledge of the prescribed therapeutic regimen will improve Outcome: Progressing

## 2023-01-25 NOTE — Progress Notes (Signed)
Physical Therapy Treatment Patient Details Name: Tracey Kramer MRN: [id number] DOB: [birth year] Today's Date: 01/25/2023   History of Present Illness 77 yo female presents to therapy s/p L TKA revision on 01/23/2023 due to aseptic loosening of tibial component and failure of conservative measures. Pt PMH includes but is not limited to: colitis, HLD, HTN, hypothyroidism, aortic regurgitation, depression, HOH, IBS, and L TKA (2019).    PT Comments  Pt reporting less pain this session, varying from 4/10 to 7/10. Pt appears lethargic at times, family and pt report pain medication causing her to be more tired. Pt amb 90 ft with RW, step to gait pattern, decreased heel-toe pattern and L knee flexion in swing, decreased LLE stance time and weight bearing, no knee buckling noted, supv for safety. Pt mobilizes LLE to EOB using gait belt strap. Pt completes transfers with supv, verbal cues for hand placement, LLE slightly extended for comfort. Pt able to complete toileting with supv, no physical assist from therapist. Pt's son and daughter in law present throughout session, discussed mobility and supv during session and at home, son and daughter in law or aide will be present with pt 24/7 during recovery, OPPT scheduled for 11/12. Pt and family report ready to d/c home with family support, all questions answered.   If plan is discharge home, recommend the following: A little help with bathing/dressing/bathroom;Assistance with cooking/housework;Assist for transportation;Help with stairs or ramp for entrance;A little help with walking and/or transfers   Can travel by private vehicle        Equipment Recommendations  None recommended by PT    Recommendations for Other Services       Precautions / Restrictions Precautions Precautions: Fall Restrictions Weight Bearing Restrictions: No     Mobility  Bed Mobility Overal bed mobility: Needs Assistance Bed Mobility: Supine to Sit     Supine to sit:  Contact guard     General bed mobility comments: pt self assists LLE to EOB with gait belt, increased time and effort    Transfers Overall transfer level: Needs assistance Equipment used: Rolling walker (2 wheels) Transfers: Sit to/from Stand Sit to Stand: Supervision           General transfer comment: supv to power to stand from EOB and toilet, verbal cues for hand placement    Ambulation/Gait Ambulation/Gait assistance: Supervision Gait Distance (Feet): 90 Feet Assistive device: Rolling walker (2 wheels) Gait Pattern/deviations: Step-to pattern, Decreased step length - left, Decreased weight shift to left, Antalgic Gait velocity: decreased     General Gait Details: step-to gait pattern, decreased LLE weightbearing and stance time, no knee buckling noted, able to complete turns and navigate room setup, no overt LOB   Stairs             Wheelchair Mobility     Tilt Bed    Modified [name] (Stroke Patients Only)       Balance Overall balance assessment: Needs assistance   Sitting balance-Leahy Scale: Good Sitting balance - Comments: sitting EOB   Standing balance support: Reliant on assistive device for balance, Bilateral upper extremity supported, During functional activity Standing balance-Leahy Scale: Fair Standing balance comment: static without UE support, dynamic with RW                            Cognition Arousal: Lethargic Behavior During Therapy: WFL for tasks assessed/performed Overall Cognitive Status: Within Functional Limits for tasks assessed  General Comments: pt and family report recent pain medication causing drowsiness        Exercises Total Joint Exercises Ankle Circles/Pumps: AROM, Both, 10 reps, Supine Quad Sets: AROM, Strengthening, Both, 10 reps, Supine     General Comments        Pertinent Vitals/Pain Pain Assessment Pain Assessment: 0-10 Pain Score: 4   Pain Location: knee Pain Descriptors / Indicators: Discomfort, Sore Pain Intervention(s): Limited activity within patient's tolerance, Monitored during session, Premedicated before session, Repositioned, Ice applied    Home Living                          Prior Function            PT Goals (current goals can now be found in the care plan section) Progress towards PT goals: Progressing toward goals    Frequency    7X/week      PT Plan      Co-evaluation              AM-PAC PT "6 Clicks" Mobility   Outcome Measure  Help needed turning from your back to your side while in a flat bed without using bedrails?: None Help needed moving from lying on your back to sitting on the side of a flat bed without using bedrails?: A Little Help needed moving to and from a bed to a chair (including a wheelchair)?: A Little Help needed standing up from a chair using your arms (e.g., wheelchair or bedside chair)?: A Little Help needed to walk in hospital room?: A Little Help needed climbing 3-5 steps with a railing? : A Little 6 Click Score: 19    End of Session Equipment Utilized During Treatment: Gait belt Activity Tolerance: Patient tolerated treatment well Patient left: in chair;with call bell/phone within reach;with chair alarm set;with family/visitor present Nurse Communication: Mobility status PT Visit Diagnosis: Unsteadiness on feet (R26.81);Other abnormalities of gait and mobility (R26.89);Muscle weakness (generalized) (M62.81);Difficulty in walking, not elsewhere classified (R26.2);Pain Pain - part of body: Knee;Leg     Time: 1340-1413 PT Time Calculation (min) (ACUTE ONLY): 33 min  Charges:    $Gait Training: 8-22 mins $Therapeutic Activity: 8-22 mins PT General Charges $$ ACUTE PT VISIT: 1 Visit                      Tori Rosellen Lichtenberger PT, DPT 01/25/23, 2:24 PM

## 2023-01-25 NOTE — Progress Notes (Signed)
Physical Therapy Treatment Patient Details Name: Tracey Kramer MRN: [id number] DOB: [birth year] Today's Date: 01/25/2023   History of Present Illness 77 yo female presents to therapy s/p L TKA revision on 01/23/2023 due to aseptic loosening of tibial component and failure of conservative measures. Pt PMH includes but is not limited to: colitis, HLD, HTN, hypothyroidism, aortic regurgitation, depression, HOH, IBS, and L TKA (2019).    PT Comments  Pt reports 6/10 pain despite pain medication upon arrival. Pt tolerates supine/seated LE exercises, no increase in pain. Pt needing min A to come to sitting EOB, supv to power up to stand with verbal cues for hand placement. Pt significant limited in amb, short steps with decreased LLE stance time and weight bearing, no knee buckling noted in standing or ambulation, pt reporting 10/10 pain and requesting to return to sitting. RN notified of pain complaints; applied ice and pt with all needs in reach.   If plan is discharge home, recommend the following: A little help with bathing/dressing/bathroom;Assistance with cooking/housework;Assist for transportation;Help with stairs or ramp for entrance;A little help with walking and/or transfers   Can travel by private vehicle        Equipment Recommendations  None recommended by PT    Recommendations for Other Services       Precautions / Restrictions Precautions Precautions: Fall Restrictions Weight Bearing Restrictions: No LLE Weight Bearing: Weight bearing as tolerated     Mobility  Bed Mobility   Bed Mobility: Supine to Sit     Supine to sit: Min assist     General bed mobility comments: assist to mobilize LLE to EOB and scoot out to EOB    Transfers Overall transfer level: Needs assistance Equipment used: Rolling walker (2 wheels) Transfers: Sit to/from Stand Sit to Stand: Supervision           General transfer comment: supv to power to stand, verbal cues for hand placement     Ambulation/Gait Ambulation/Gait assistance: Contact guard assist Gait Distance (Feet): 20 Feet Assistive device: Rolling walker (2 wheels) Gait Pattern/deviations: Step-to pattern, Decreased step length - left, Decreased weight shift to left, Antalgic Gait velocity: decreased     General Gait Details: no knee buckling noted with weight shifting, static marching so progressed to forward amb and no knee buckling noted; pt with short step-to gait pattern, significant L knee pain complaints limiting amb tolerance- RN notified   Stairs             Wheelchair Mobility     Tilt Bed    Modified [name] (Stroke Patients Only)       Balance Overall balance assessment: Needs assistance   Sitting balance-Leahy Scale: Good Sitting balance - Comments: sitting EOB   Standing balance support: Reliant on assistive device for balance, Bilateral upper extremity supported, During functional activity Standing balance-Leahy Scale: Poor                              Cognition Arousal: Alert Behavior During Therapy: WFL for tasks assessed/performed Overall Cognitive Status: Within Functional Limits for tasks assessed                                          Exercises Total Joint Exercises Ankle Circles/Pumps: AROM, Both, 10 reps, Supine Quad Sets: AROM, Strengthening, Both, 10 reps, Supine Long Arc Quad:  AROM, 10 reps, Left, Seated    General Comments        Pertinent Vitals/Pain Pain Assessment Pain Assessment: 0-10 Pain Score: 10-Worst pain ever (6/10 at rest, 10/10 with movement) Pain Location: knee Pain Descriptors / Indicators: Discomfort, Sore Pain Intervention(s): Limited activity within patient's tolerance, Monitored during session, Premedicated before session, Repositioned, Ice applied    Home Living                          Prior Function            PT Goals (current goals can now be found in the care plan section)  Progress towards PT goals: Not progressing toward goals - comment (limited by pain)    Frequency    7X/week      PT Plan      Co-evaluation              AM-PAC PT "6 Clicks" Mobility   Outcome Measure  Help needed turning from your back to your side while in a flat bed without using bedrails?: None Help needed moving from lying on your back to sitting on the side of a flat bed without using bedrails?: A Little Help needed moving to and from a bed to a chair (including a wheelchair)?: A Little Help needed standing up from a chair using your arms (e.g., wheelchair or bedside chair)?: A Little Help needed to walk in hospital room?: A Little Help needed climbing 3-5 steps with a railing? : A Little 6 Click Score: 19    End of Session Equipment Utilized During Treatment: Gait belt Activity Tolerance: Patient limited by pain Patient left: in chair;with call bell/phone within reach;with chair alarm set Nurse Communication: Mobility status PT Visit Diagnosis: Unsteadiness on feet (R26.81);Other abnormalities of gait and mobility (R26.89);Muscle weakness (generalized) (M62.81);Difficulty in walking, not elsewhere classified (R26.2);Pain Pain - part of body: Knee;Leg     Time: 9147-8295 PT Time Calculation (min) (ACUTE ONLY): 28 min  Charges:    $Therapeutic Exercise: 8-22 mins $Therapeutic Activity: 8-22 mins PT General Charges $$ ACUTE PT VISIT: 1 Visit                     Tori Shawnita Krizek PT, DPT 01/25/23, 10:32 AM

## 2023-01-25 NOTE — Progress Notes (Signed)
   Subjective: 2 Days Post-Op Procedure(s) (LRB): Left knee tibial knee arthroplasty revision (Left) Patient reports pain as mild.   Patient seen in rounds by Dr. Lequita Halt. Patient is well, and has had no acute complaints or problems. Feeling better this morning.  Plan is to go Home after hospital stay.  Objective: Vital signs in last 24 hours: Temp:  [97.9 F (36.6 C)-98.9 F (37.2 C)] 98.9 F (37.2 C) (11/08 0529) Pulse Rate:  [63-75] 75 (11/08 0529) Resp:  [15-18] 18 (11/08 0529) BP: (101-148)/(39-85) 148/47 (11/08 0529) SpO2:  [94 %-97 %] 97 % (11/08 0529)  Intake/Output from previous day:  Intake/Output Summary (Last 24 hours) at 01/25/2023 0754 Last data filed at 01/25/2023 0529 Gross per 24 hour  Intake 970 ml  Output 350 ml  Net 620 ml    Intake/Output this shift: No intake/output data recorded.  Labs: Recent Labs    01/24/23 0327 01/25/23 0315  HGB 10.4* 10.0*   Recent Labs    01/24/23 0327 01/25/23 0315  WBC 7.9 8.6  RBC 3.47* 3.28*  HCT 31.5* 30.3*  PLT 274 251   Recent Labs    01/24/23 0327  NA 139  K 3.7  CL 105  CO2 25  BUN 11  CREATININE 0.50  GLUCOSE 140*  CALCIUM 8.5*   No results for input(s): "LABPT", "INR" in the last 72 hours.  Exam: General - Patient is Alert and Oriented Extremity - Neurologically intact Neurovascular intact Sensation intact distally Dorsiflexion/Plantar flexion intact Dressing/Incision - clean, dry, no drainage Motor Function - intact, moving foot and toes well on exam.   Past Medical History:  Diagnosis Date   Acid reflux occasional   Acute lateral meniscal tear LEFT KNEE   Anemia    Anxiety    AR (aortic regurgitation) 11/06/2017   Moderate, noted on ECHO   Arthritis    Constipation    Depression    Heart murmur    Heart palpitations    HOH (hard of hearing)    Hyperlipidemia    Hypertension    Hypothyroidism    IBS (irritable bowel syndrome)    MR (mitral regurgitation) 11/06/2017    Mild, noted on ECHO   PONV (postoperative nausea and vomiting)    PR (pulmonary regurgitation) 11/11/2017   Mild, noted on ECHO   Thyroid disease    hypothyroidism    TR (tricuspid regurgitation) 11/11/2017   Mild, noted on ECHO    Assessment/Plan: 2 Days Post-Op Procedure(s) (LRB): Left knee tibial knee arthroplasty revision (Left) Principal Problem:   Failed total knee arthroplasty (HCC) Active Problems:   Failed total left knee replacement (HCC)  Estimated body mass index is 22.32 kg/m as calculated from the following:   Height as of this encounter: 5' 2.5" (1.588 m).   Weight as of this encounter: 56.2 kg. Up with therapy  DVT Prophylaxis - Aspirin Weight-bearing as tolerated  Did much better with therapy during second session yesterday. Will plan for d/c today  Arther Abbott, PA-C Orthopedic Surgery [phone] 01/25/2023, 7:54 AM

## 2023-02-01 NOTE — Discharge Summary (Signed)
Patient ID: Tracey Kramer MRN: [id number] DOB/AGE: June 19, 1945 77 y.o.  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[facility])   Discharge Diagnoses:  Same  Past Medical History:  Diagnosis Date   Acid reflux occasional   Acute lateral meniscal tear LEFT KNEE   Anemia    Anxiety    AR (aortic regurgitation) 11/06/2017   Moderate, noted on ECHO   Arthritis    Constipation    Depression    Heart murmur    Heart palpitations    HOH (hard of hearing)    Hyperlipidemia    Hypertension    Hypothyroidism    IBS (irritable bowel syndrome)    MR (mitral regurgitation) 11/06/2017   Mild, noted on ECHO   PONV (postoperative nausea and vomiting)    PR (pulmonary regurgitation) 11/11/2017   Mild, noted on ECHO   Thyroid disease    hypothyroidism    TR (tricuspid regurgitation) 11/11/2017   Mild, noted on ECHO    Surgeries: Procedure(s): Left knee tibial knee arthroplasty revision on 01/23/2023   Consultants:   Discharged Condition: Improved  Hospital Course: Tracey Kramer is an 77 y.o. female who was admitted 01/23/2023 for operative treatment ofFailed total knee arthroplasty (HCC). Patient has severe unremitting pain that affects sleep, daily activities, and work/hobbies. After pre-op clearance the patient was taken to the operating room on 01/23/2023 and underwent  Procedure(s): Left knee tibial knee arthroplasty revision.    Patient was given perioperative antibiotics:  Anti-infectives (From admission, onward)    Start     Dose/Rate Route Frequency Ordered Stop   01/23/23 1930  ceFAZolin (ANCEF) IVPB 2g/100 mL premix        2 g 200 mL/hr over 30 Minutes Intravenous Every 6 hours 01/23/23 1608 01/24/23 0300   01/23/23 1215  ceFAZolin (ANCEF) IVPB 2g/100 mL premix        2 g 200 mL/hr over 30 Minutes Intravenous On call to O.R.  01/23/23 1204 01/23/23 1349   01/23/23 1206  ceFAZolin (ANCEF) 2-4 GM/100ML-% IVPB       Note to Pharmacy: Linard Millers: cabinet override      01/23/23 1206 01/23/23 1329        Patient was given sequential compression devices, early ambulation, and chemoprophylaxis to prevent DVT.  Patient benefited maximally from hospital stay and there were no complications.    Recent vital signs: No data found.   Recent laboratory studies: No results for input(s): "WBC", "HGB", "HCT", "PLT", "NA", "K", "CL", "CO2", "BUN", "CREATININE", "GLUCOSE", "INR", "CALCIUM" in the last 72 hours.  Invalid input(s): "PT", "2"   Discharge Medications:   Allergies as of 01/25/2023       Reactions   Amoxicillin Rash   Has patient had a PCN reaction causing immediate rash, facial/tongue/throat swelling, SOB or lightheadedness with hypotensionNO Has patient had a PCN reaction causing severe rash involving mucus membranes or skin necrosis: NO Has patient had a PCN reaction that required hospitalization: NO Has patient had a PCN reaction occurring within the last 10 years:NO If all of the above answers are "NO", then may proceed with Cephalosporin use.        Medication List     TAKE these medications    aspirin 81 MG chewable tablet Chew 1 tablet (81 mg total) by mouth 2 (two) times daily for 20 days. Then take one 45  mg aspirin once a day for three weeks. Then discontinue aspirin.   dicyclomine 20 MG tablet Commonly known as: BENTYL Take 20 mg by mouth 3 (three) times daily as needed for spasms.   Magnesium 500 MG Caps Take 500 mg by mouth in the morning.   methocarbamol 500 MG tablet Commonly known as: ROBAXIN Take 1 tablet (500 mg total) by mouth every 6 (six) hours as needed for muscle spasms.   mometasone 0.1 % cream Commonly known as: ELOCON Apply 1 Application topically daily. Applied to outside of ear   OMEGA DHA PO Take 2 capsules by mouth in the morning.   omeprazole 40 MG  capsule Commonly known as: PRILOSEC Take 40 mg by mouth in the morning and at bedtime.   ondansetron 4 MG tablet Commonly known as: ZOFRAN Take 1 tablet (4 mg total) by mouth every 6 (six) hours as needed for nausea.   oxyCODONE 5 MG immediate release tablet Commonly known as: Oxy IR/ROXICODONE Take 1-2 tablets (5-10 mg total) by mouth every 8 (eight) hours as needed for severe pain (pain score 7-10).   PARoxetine 20 MG tablet Commonly known as: PAXIL Take 20 mg by mouth in the morning.   polyethylene glycol 17 g packet Commonly known as: MIRALAX / GLYCOLAX Take 17 g by mouth daily as needed (congestion.).   rosuvastatin 20 MG tablet Commonly known as: CRESTOR Take 10 mg by mouth every evening.   thyroid 90 MG tablet Commonly known as: ARMOUR Take 90 mg by mouth daily before breakfast.   traZODone 100 MG tablet Commonly known as: DESYREL Take 50 mg by mouth at bedtime.   triamterene-hydrochlorothiazide 37.5-25 MG capsule Commonly known as: DYAZIDE Take 1 capsule by mouth in the morning.   TURMERIC CURCUMIN PO Take 2 capsules by mouth in the morning. 1500 mg/capsule               Discharge Care Instructions  (From admission, onward)           Start     Ordered   01/24/23 0000  Weight bearing as tolerated        01/24/23 0814   01/24/23 0000  Change dressing       Comments: You may remove the bulky bandage (ACE wrap and gauze) two days after surgery. You will have an adhesive waterproof bandage underneath. Leave this in place until your first follow-up appointment.   01/24/23 0814            Diagnostic Studies: No results found.  Disposition: Discharge disposition: 01-Home or Self Care       Discharge Instructions     Call MD / Call 911   Complete by: As directed    If you experience chest pain or shortness of breath, CALL 911 and be transported to the hospital emergency room.  If you develope a fever above 101 F, pus (white drainage) or  increased drainage or redness at the wound, or calf pain, call your surgeon's office.   Change dressing   Complete by: As directed    You may remove the bulky bandage (ACE wrap and gauze) two days after surgery. You will have an adhesive waterproof bandage underneath. Leave this in place until your first follow-up appointment.   Constipation Prevention   Complete by: As directed    Drink plenty of fluids.  Prune juice may be helpful.  You may use a stool softener, such as Colace (over the counter) 100 mg twice a day.  Use MiraLax (over the counter) for constipation as needed.   Diet - low sodium heart healthy   Complete by: As directed    Do not put a pillow under the knee. Place it under the heel.   Complete by: As directed    Driving restrictions   Complete by: As directed    No driving for two weeks   Post-operative opioid taper instructions:   Complete by: As directed    POST-OPERATIVE OPIOID TAPER INSTRUCTIONS: It is important to wean off of your opioid medication as soon as possible. If you do not need pain medication after your surgery it is ok to stop day one. Opioids include: Codeine, Hydrocodone(Norco, Vicodin), Oxycodone(Percocet, oxycontin) and hydromorphone amongst others.  Long term and even short term use of opiods can cause: Increased pain response Dependence Constipation Depression Respiratory depression And more.  Withdrawal symptoms can include Flu like symptoms Nausea, vomiting And more Techniques to manage these symptoms Hydrate well Eat regular healthy meals Stay active Use relaxation techniques(deep breathing, meditating, yoga) Do Not substitute Alcohol to help with tapering If you have been on opioids for less than two weeks and do not have pain than it is ok to stop all together.  Plan to wean off of opioids This plan should start within one week post op of your joint replacement. Maintain the same interval or time between taking each dose and first  decrease the dose.  Cut the total daily intake of opioids by one tablet each day Next start to increase the time between doses. The last dose that should be eliminated is the evening dose.      TED hose   Complete by: As directed    Use stockings (TED hose) for three weeks on both leg(s).  You may remove them at night for sleeping.   Weight bearing as tolerated   Complete by: As directed         Follow-up Information     Aluisio, Homero Fellers, MD. Schedule an appointment as soon as possible for a visit in 2 week(s).   Specialty: Orthopedic Surgery Contact information: [address] [place] [address] [phone]                  Signed: Arther Abbott 02/01/2023, 1:15 PM
